# Patient Record
Sex: Female | Born: 1935 | Race: White | Hispanic: No | Marital: Married | Smoking: Former smoker
Health system: Southern US, Community
[De-identification: ages and names within clinical notes are randomized; demographics above are authoritative.]

## PROBLEM LIST (undated history)

## (undated) DIAGNOSIS — F419 Anxiety disorder, unspecified: Secondary | ICD-10-CM

## (undated) DIAGNOSIS — I8390 Asymptomatic varicose veins of unspecified lower extremity: Secondary | ICD-10-CM

## (undated) DIAGNOSIS — I219 Acute myocardial infarction, unspecified: Secondary | ICD-10-CM

## (undated) DIAGNOSIS — F0390 Unspecified dementia without behavioral disturbance: Secondary | ICD-10-CM

## (undated) HISTORY — DX: Anxiety disorder, unspecified: F41.9

## (undated) HISTORY — DX: Unspecified dementia, unspecified severity, without behavioral disturbance, psychotic disturbance, mood disturbance, and anxiety: F03.90

## (undated) HISTORY — PX: VARICOSE VEIN SURGERY: SHX832

---

## 2011-04-30 ENCOUNTER — Ambulatory Visit (INDEPENDENT_AMBULATORY_CARE_PROVIDER_SITE_OTHER): Payer: Medicare Other | Admitting: General Surgery

## 2011-04-30 ENCOUNTER — Encounter (INDEPENDENT_AMBULATORY_CARE_PROVIDER_SITE_OTHER): Payer: Self-pay

## 2011-04-30 ENCOUNTER — Encounter (INDEPENDENT_AMBULATORY_CARE_PROVIDER_SITE_OTHER): Payer: Self-pay | Admitting: General Surgery

## 2011-04-30 VITALS — BP 112/68 | HR 88 | Temp 98.2°F | Ht 60.0 in | Wt 150.0 lb

## 2011-04-30 DIAGNOSIS — C44529 Squamous cell carcinoma of skin of other part of trunk: Secondary | ICD-10-CM

## 2011-04-30 DIAGNOSIS — C210 Malignant neoplasm of anus, unspecified: Secondary | ICD-10-CM | POA: Insufficient documentation

## 2011-04-30 NOTE — Progress Notes (Signed)
HPI The patient comes in with a lesion on the left side of her anterior perianal area looking like a basal cell cancer or a squamous cell cancer. It has been there for at least the past month.  PE On examination she has a 1 cm circular partially reduce this lesion with some induration of the surrounding skin. There is no erythema or infection. It does not extend into the depths of the skin and does not extend into the rectum.  Under sterile technique with local anesthetic I was able to remove a small piece of this lesion and sent it for biopsy. No attempt to excise this was done in the office today.  The bleeding from this biopsy was controlled with silver nitrate sticks.  Studiy review Currently there are no studies to review.  Assessment Verrucous lesion a centimeter in size in the perianal area likely representing either a basal cell cancer squamous cell cancer.  Plan I will see the patient back in my office to review the pathology. It happened to get the pathology before her next clinic appointment we will call her with results further plans for surgery and will be made at the next clinic appointment.

## 2011-05-12 ENCOUNTER — Encounter (INDEPENDENT_AMBULATORY_CARE_PROVIDER_SITE_OTHER): Payer: Medicare Other | Admitting: General Surgery

## 2011-05-26 ENCOUNTER — Ambulatory Visit (INDEPENDENT_AMBULATORY_CARE_PROVIDER_SITE_OTHER): Payer: Medicare Other | Admitting: General Surgery

## 2011-05-26 ENCOUNTER — Encounter (INDEPENDENT_AMBULATORY_CARE_PROVIDER_SITE_OTHER): Payer: Self-pay | Admitting: General Surgery

## 2011-05-26 ENCOUNTER — Telehealth (INDEPENDENT_AMBULATORY_CARE_PROVIDER_SITE_OTHER): Payer: Self-pay | Admitting: General Surgery

## 2011-05-26 VITALS — BP 140/70 | HR 68 | Temp 97.8°F | Resp 18 | Ht 61.0 in | Wt 152.0 lb

## 2011-05-26 DIAGNOSIS — C44529 Squamous cell carcinoma of skin of other part of trunk: Secondary | ICD-10-CM | POA: Insufficient documentation

## 2011-05-26 NOTE — Progress Notes (Signed)
HPI The patient is status post incisional biopsy of a perianal mass.  The pathology demonstrated a squamous cell carcinoma.  PE On examination she has about a 1 cm residual mass in the perianal area on the left side. Slightly anterior to the anal orifice.  Studiy review There are currently no studies to review.  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  Plan The proper treatment for this would be local excision. This will be with his a centimeter margin. This has to be done as an outpatient procedure likely at [facility]

## 2011-06-24 ENCOUNTER — Encounter (HOSPITAL_COMMUNITY): Payer: Self-pay | Admitting: Pharmacy Technician

## 2011-07-01 ENCOUNTER — Telehealth (INDEPENDENT_AMBULATORY_CARE_PROVIDER_SITE_OTHER): Payer: Self-pay | Admitting: General Surgery

## 2011-07-01 ENCOUNTER — Encounter (HOSPITAL_COMMUNITY): Payer: Self-pay | Admitting: *Deleted

## 2011-07-01 NOTE — Progress Notes (Addendum)
A companion will be with Mrs. Yip the day of surgery.  This companion is unable to do any care for patient.  Should patient need dressing changes, she will need Home Health, preferably [facility].   Pt may say that she can change her dressing, but she will forget And here is no one in the home to do the dressing.    >10 years ago pt had n EKG that said MI, undetermined age.  Pt and family are unaware of when she had MI. This EKG was done in [place].  Pts daughter said that she that patient has not had a EKG sincce she moved here 2 years  Ago.  SHe doe not think Dr Katherina Right has a srecord of this EKG.  Pt's medically MD Is Dr Katherina Right.     Companion's name is Actor.  Information given to [facility] at Dr Dixon Boos office.

## 2011-07-01 NOTE — Telephone Encounter (Signed)
Jan, at [facility], calling about Dr. Dixon Boos patient having surgery Monday.  Pt's daughter has POA and is in [place] state communicating closely with all involved with her mother's care.  She states her Mom has "mild dementia" and will answer "yes" to nearly all questions.  Consequently, if asked,she will say she can do her own dressing changes, when she really cannot.  There is only a companion at her home.  She will need home health (prefers [place]) if any medical procedures are due after discharge to home.

## 2011-07-05 MED ORDER — DEXTROSE 5 % IV SOLN
1.0000 g | INTRAVENOUS | Status: AC
  Administered 2011-07-06: 1 g via INTRAVENOUS
  Filled 2011-07-05: qty 1

## 2011-07-06 ENCOUNTER — Encounter (HOSPITAL_COMMUNITY): Payer: Self-pay | Admitting: Anesthesiology

## 2011-07-06 ENCOUNTER — Encounter (HOSPITAL_COMMUNITY)
Admission: RE | Disposition: A | Discharge status: Home / Self Care | Payer: Self-pay | Source: Ambulatory Visit | Attending: General Surgery

## 2011-07-06 ENCOUNTER — Ambulatory Visit (HOSPITAL_COMMUNITY): Payer: Medicare Other

## 2011-07-06 ENCOUNTER — Ambulatory Visit (HOSPITAL_COMMUNITY): Payer: Medicare Other | Admitting: Anesthesiology

## 2011-07-06 ENCOUNTER — Ambulatory Visit (HOSPITAL_COMMUNITY)
Admission: RE | Admit: 2011-07-06 | Discharge: 2011-07-06 | Disposition: A | Discharge status: Home / Self Care | Payer: Medicare Other | Source: Ambulatory Visit | Attending: General Surgery | Admitting: General Surgery

## 2011-07-06 DIAGNOSIS — C44509 Unspecified malignant neoplasm of skin of other part of trunk: Secondary | ICD-10-CM | POA: Insufficient documentation

## 2011-07-06 DIAGNOSIS — C44529 Squamous cell carcinoma of skin of other part of trunk: Secondary | ICD-10-CM

## 2011-07-06 DIAGNOSIS — C44599 Other specified malignant neoplasm of skin of other part of trunk: Secondary | ICD-10-CM | POA: Insufficient documentation

## 2011-07-06 DIAGNOSIS — L940 Localized scleroderma [morphea]: Secondary | ICD-10-CM | POA: Insufficient documentation

## 2011-07-06 HISTORY — DX: Asymptomatic varicose veins of unspecified lower extremity: I83.90

## 2011-07-06 HISTORY — PX: LESION DESTRUCTION: SHX5236

## 2011-07-06 HISTORY — PX: OTHER SURGICAL HISTORY: SHX169

## 2011-07-06 HISTORY — DX: Acute myocardial infarction, unspecified: I21.9

## 2011-07-06 LAB — DIFFERENTIAL
Basophils Absolute: 0 10*3/uL (ref 0.0–0.1)
Eosinophils Relative: 4 % (ref 0–5)
Lymphocytes Relative: 25 % (ref 12–46)
Monocytes Absolute: 0.6 10*3/uL (ref 0.1–1.0)

## 2011-07-06 LAB — BASIC METABOLIC PANEL
BUN: 17 mg/dL (ref 6–23)
CO2: 27 mEq/L (ref 19–32)
Chloride: 104 mEq/L (ref 96–112)
Creatinine, Ser: 0.87 mg/dL (ref 0.50–1.10)
Glucose, Bld: 93 mg/dL (ref 70–99)

## 2011-07-06 LAB — CBC
HCT: 44.4 % (ref 36.0–46.0)
MCV: 93.5 fL (ref 78.0–100.0)
RDW: 12.4 % (ref 11.5–15.5)
WBC: 5.9 10*3/uL (ref 4.0–10.5)

## 2011-07-06 SURGERY — DESTRUCTION, LESION, ANUS
Anesthesia: General | Site: Rectum | Wound class: Clean Contaminated

## 2011-07-06 MED ORDER — MIDAZOLAM HCL 2 MG/2ML IJ SOLN: 1.0000 mg | INTRAMUSCULAR | Status: DC | PRN

## 2011-07-06 MED ORDER — 0.9 % SODIUM CHLORIDE (POUR BTL) OPTIME
TOPICAL | Status: DC | PRN
  Administered 2011-07-06: 1000 mL

## 2011-07-06 MED ORDER — HYDROMORPHONE HCL PF 1 MG/ML IJ SOLN: 0.2500 mg | INTRAMUSCULAR | Status: DC | PRN

## 2011-07-06 MED ORDER — LACTATED RINGERS IV SOLN
INTRAVENOUS | Status: DC
  Administered 2011-07-06: 11:00:00 via INTRAVENOUS

## 2011-07-06 MED ORDER — GLYCOPYRROLATE 0.2 MG/ML IJ SOLN
INTRAMUSCULAR | Status: DC | PRN
  Administered 2011-07-06: .2 mg via INTRAVENOUS

## 2011-07-06 MED ORDER — POVIDONE-IODINE 10 % EX OINT
TOPICAL_OINTMENT | CUTANEOUS | Status: DC | PRN
  Administered 2011-07-06: 1 via TOPICAL

## 2011-07-06 MED ORDER — FENTANYL CITRATE 0.05 MG/ML IJ SOLN
INTRAMUSCULAR | Status: DC | PRN
  Administered 2011-07-06: 100 ug via INTRAVENOUS
  Administered 2011-07-06: 50 ug via INTRAVENOUS

## 2011-07-06 MED ORDER — EPHEDRINE SULFATE 50 MG/ML IJ SOLN
INTRAMUSCULAR | Status: DC | PRN
  Administered 2011-07-06: 5 mg via INTRAVENOUS
  Administered 2011-07-06: 10 mg via INTRAVENOUS

## 2011-07-06 MED ORDER — PROPOFOL 10 MG/ML IV EMUL
INTRAVENOUS | Status: DC | PRN
  Administered 2011-07-06: 150 mg via INTRAVENOUS
  Administered 2011-07-06: 20 mg via INTRAVENOUS

## 2011-07-06 MED ORDER — LORAZEPAM 2 MG/ML IJ SOLN: 1.0000 mg | Freq: Once | INTRAMUSCULAR | Status: DC | PRN

## 2011-07-06 MED ORDER — BUPIVACAINE-EPINEPHRINE 0.25% -1:200000 IJ SOLN
INTRAMUSCULAR | Status: DC | PRN
  Administered 2011-07-06: 9 mL

## 2011-07-06 MED ORDER — MUPIROCIN 2 % EX OINT
TOPICAL_OINTMENT | CUTANEOUS | Status: AC
  Filled 2011-07-06: qty 22

## 2011-07-06 MED ORDER — ONDANSETRON HCL 4 MG/2ML IJ SOLN
INTRAMUSCULAR | Status: DC | PRN
  Administered 2011-07-06: 4 mg via INTRAVENOUS

## 2011-07-06 MED ORDER — LACTATED RINGERS IV SOLN
INTRAVENOUS | Status: DC | PRN
  Administered 2011-07-06 (×2): via INTRAVENOUS

## 2011-07-06 MED ORDER — TRAMADOL HCL 50 MG PO TABS: 50.0000 mg | ORAL_TABLET | Freq: Four times a day (QID) | ORAL | Status: AC | PRN

## 2011-07-06 MED ORDER — FENTANYL CITRATE 0.05 MG/ML IJ SOLN: 50.0000 ug | INTRAMUSCULAR | Status: DC | PRN

## 2011-07-06 SURGICAL SUPPLY — 41 items
BENZOIN TINCTURE PRP APPL 2/3 (GAUZE/BANDAGES/DRESSINGS) ×2 IMPLANT
BLADE SURG 15 STRL LF DISP TIS (BLADE) ×1 IMPLANT
BLADE SURG 15 STRL SS (BLADE) ×1
CANISTER SUCTION 2500CC (MISCELLANEOUS) ×2 IMPLANT
CLEANER TIP ELECTROSURG 2X2 (MISCELLANEOUS) ×2 IMPLANT
CLOTH BEACON ORANGE TIMEOUT ST (SAFETY) ×2 IMPLANT
COVER SURGICAL LIGHT HANDLE (MISCELLANEOUS) ×2 IMPLANT
DRAPE LAPAROTOMY T 102X78X121 (DRAPES) ×2 IMPLANT
DRAPE UTILITY 15X26 W/TAPE STR (DRAPE) ×4 IMPLANT
DRSG PAD ABDOMINAL 8X10 ST (GAUZE/BANDAGES/DRESSINGS) ×2 IMPLANT
ELECT REM PT RETURN 9FT ADLT (ELECTROSURGICAL) ×2
ELECTRODE REM PT RTRN 9FT ADLT (ELECTROSURGICAL) ×1 IMPLANT
GAUZE SPONGE 4X4 16PLY XRAY LF (GAUZE/BANDAGES/DRESSINGS) ×2 IMPLANT
GLOVE BIOGEL PI IND STRL 7.0 (GLOVE) ×1 IMPLANT
GLOVE BIOGEL PI IND STRL 8 (GLOVE) ×1 IMPLANT
GLOVE BIOGEL PI INDICATOR 7.0 (GLOVE) ×1
GLOVE BIOGEL PI INDICATOR 8 (GLOVE) ×1
GLOVE ECLIPSE 7.5 STRL STRAW (GLOVE) ×2 IMPLANT
GLOVE SURG SS PI 6.5 STRL IVOR (GLOVE) ×2 IMPLANT
GOWN STRL NON-REIN LRG LVL3 (GOWN DISPOSABLE) ×4 IMPLANT
KIT BASIN OR (CUSTOM PROCEDURE TRAY) ×2 IMPLANT
KIT ROOM TURNOVER OR (KITS) ×2 IMPLANT
NEEDLE HYPO 25X1 1.5 SAFETY (NEEDLE) ×2 IMPLANT
NS IRRIG 1000ML POUR BTL (IV SOLUTION) ×2 IMPLANT
PACK LITHOTOMY IV (CUSTOM PROCEDURE TRAY) ×2 IMPLANT
PAD ARMBOARD 7.5X6 YLW CONV (MISCELLANEOUS) ×2 IMPLANT
PENCIL BUTTON HOLSTER BLD 10FT (ELECTRODE) ×2 IMPLANT
SPECIMEN JAR SMALL (MISCELLANEOUS) ×2 IMPLANT
SPONGE GAUZE 4X4 12PLY (GAUZE/BANDAGES/DRESSINGS) ×2 IMPLANT
SPONGE SURGIFOAM ABS GEL 100 (HEMOSTASIS) IMPLANT
SURGILUBE 2OZ TUBE FLIPTOP (MISCELLANEOUS) ×2 IMPLANT
SUT CHROMIC 3 0 SH 27 (SUTURE) IMPLANT
SUT ETHILON 3 0 FSL (SUTURE) ×4 IMPLANT
SYR CONTROL 10ML LL (SYRINGE) ×2 IMPLANT
TOWEL OR 17X24 6PK STRL BLUE (TOWEL DISPOSABLE) ×2 IMPLANT
TOWEL OR 17X26 10 PK STRL BLUE (TOWEL DISPOSABLE) ×2 IMPLANT
TRAY PROCTOSCOPIC FIBER OPTIC (SET/KITS/TRAYS/PACK) IMPLANT
TUBE CONNECTING 12X1/4 (SUCTIONS) ×2 IMPLANT
UNDERPAD 30X30 INCONTINENT (UNDERPADS AND DIAPERS) ×2 IMPLANT
WATER STERILE IRR 1000ML POUR (IV SOLUTION) IMPLANT
YANKAUER SUCT BULB TIP NO VENT (SUCTIONS) ×2 IMPLANT

## 2011-07-06 NOTE — Discharge Instructions (Signed)
Skin Cancer Most skin cancers occur on the face, neck, arms, or on sun-exposed areas. Damage from excessive sunlight exposure is the most common cause of skin cancer. People with light skin are more likely to develop skin cancer than people with more skin pigment, but skin cancers occur in people of all races. Squamous and basal cell cancers are the most common types. They are both slow-growing and easy to recognize. They often form scaly open sores that do not heal. Melanoma is a skin cancer that looks like a mole. Most moles are harmless, especially if they do not change over time. Most skin cancers can be prevented by avoiding excessive sunlight exposure. Signs that a skin sore or mole may be cancerous include:  A sore that does not heal or one that bleeds or slowly gets bigger.   Moles that are growing or are larger than a pencil eraser, or ones that have irregular borders or color variations.   Moles that have a bump, a raised area, or cause itching or pain.  If you have a skin sore or mole that could be cancer, it is important that you call your doctor right away for a skin exam and possible skin biopsy to determine the best treatment. Document Released: 04/09/2004 Document Revised: 02/19/2011 Document Reviewed: 07/20/2008 [facility] Patient Information 2012 [place], [place].  Keep clean and dry.  Return to see me in two weeks.

## 2011-07-06 NOTE — Op Note (Signed)
OPERATIVE REPORT  DATE OF OPERATION: 07/06/2011  PATIENT:  Laurie Woods  78 y.o. female  PRE-OPERATIVE DIAGNOSIS:  squamous cell cancer of peri-anal area  POST-OPERATIVE DIAGNOSIS:  squamous cell cancer of peri-anal area  PROCEDURE:  Procedure(s): DESTRUCTION LESION ANUS  SURGEON:  Surgeon(s): Cherylynn Ridges, MD  ASSISTANT: None   ANESTHESIA:   general  EBL: <20 ml  BLOOD ADMINISTERED: none  DRAINS: none   SPECIMEN:  Source of Specimen:  Left anterior peri-anal eleiptical skin   COUNTS CORRECT:  YES  PROCEDURE DETAILS: The patient was taken to the operating room and placed on the table in the supine position. After an adequate general laryngeal area anesthetic was administered she was placed in lithotomy and prepped and draped in usual sterile manner exposing the perianal area.  The patient had previously had a 1-1/2 cm circular raised lesion on the left anterior portion of the perianal area. Since the last examination this is completely disappeared and all she has serous scar tissue. This is a regression of a previously diagnosed squamous cell carcinoma in the perianal area.  Because of the possibility that this could return I did re\re excise the area. This was done after proper timeout was performed identifying the patient and procedure be performed. An elliptical piece of skin including the previously taken biopsy site was done. It measured approximately 1 x 3 cm in size. It was subsequently closed with interrupted 3-0 nylon sutures a total of 7 sutures were used.  A sterile dressing was applied.  PATIENT DISPOSITION:  PACU - hemodynamically stable.   Clover Feehan III,Mervin Ramires O 4/22/20131:19 PM

## 2011-07-06 NOTE — Preoperative (Signed)
Beta Blockers   Reason not to administer Beta Blockers:Not Applicable 

## 2011-07-06 NOTE — Anesthesia Postprocedure Evaluation (Signed)
  Anesthesia Post-op Note  Patient: Laurie Woods  Procedure(s) Performed: Procedure(s) (LRB): DESTRUCTION LESION ANUS (N/A)  Patient Location: PACU  Anesthesia Type: General  Level of Consciousness: awake and alert   Airway and Oxygen Therapy: Patient Spontanous Breathing  Post-op Pain: mild  Post-op Assessment: Post-op Vital signs reviewed, Patient's Cardiovascular Status Stable, Respiratory Function Stable and Patent Airway  Post-op Vital Signs: stable  Complications: No apparent anesthesia complications

## 2011-07-06 NOTE — H&P (Signed)
  HPI  The patient comes in with a lesion on the left side of her anterior perianal area looking like a basal cell cancer or a squamous cell cancer. It has been there for at least the past month.  PE  On examination she has a 1 cm circular partially reduce this lesion with some induration of the surrounding skin. There is no erythema or infection. It does not extend into the depths of the skin and does not extend into the rectum.  Under sterile technique with local anesthetic I was able to remove a small piece of this lesion and sent it for biopsy. No attempt to excise this was done in the office today.  The bleeding from this biopsy was controlled with silver nitrate sticks.  Studiy review  Currently there are no studies to review.  Assessment  Verrucous lesion a centimeter in size in the perianal area likely representing either a basal cell cancer squamous cell cancer.  Plan  I will see the patient back in my office to review the pathology. It happened to get the pathology before her next clinic appointment we will call her with results further plans for surgery and will be made at the next clinic appointment.      The patient and the family are aware of the diagnosis of peri-anal squamous cell carcinoma.  The patient has signficant dementia and does not recall most of our conversation.  Marta Lamas. Gae Bon, MD, FACS [phone] [facility]

## 2011-07-06 NOTE — Progress Notes (Signed)
Mesh panties in place

## 2011-07-06 NOTE — Transfer of Care (Signed)
Immediate Anesthesia Transfer of Care Note  Patient: Laurie Woods  Procedure(s) Performed: Procedure(s) (LRB): DESTRUCTION LESION ANUS (N/A)  Patient Location: PACU  Anesthesia Type: General  Level of Consciousness: awake  Airway & Oxygen Therapy: Patient Spontanous Breathing and Patient connected to nasal cannula oxygen  Post-op Assessment: Report given to PACU RN and Post -op Vital signs reviewed and stable  Post vital signs: Reviewed and stable  Complications: No apparent anesthesia complications

## 2011-07-06 NOTE — Anesthesia Procedure Notes (Signed)
Procedure Name: LMA Insertion Date/Time: 07/06/2011 12:48 PM Performed by: Sherie Don Pre-anesthesia Checklist: Patient identified, Emergency Drugs available, Suction available, Patient being monitored and Timeout performed Patient Re-evaluated:Patient Re-evaluated prior to inductionOxygen Delivery Method: Circle system utilized Preoxygenation: Pre-oxygenation with 100% oxygen Intubation Type: IV induction LMA: LMA inserted LMA Size: 4.0 Number of attempts: 1

## 2011-07-06 NOTE — Anesthesia Preprocedure Evaluation (Addendum)
Anesthesia Evaluation  Patient identified by MRN, date of birth, ID band Patient awake    Reviewed: Allergy & Precautions, H&P , NPO status , Patient's Chart, lab work & pertinent test results  Airway Mallampati: I TM Distance: >3 FB Neck ROM: Full    Dental  (+) Teeth Intact and Dental Advisory Given   Pulmonary    Pulmonary exam normal       Cardiovascular + Past MI     Neuro/Psych Anxiety    GI/Hepatic   Endo/Other    Renal/GU      Musculoskeletal   Abdominal   Peds  Hematology   Anesthesia Other Findings   Reproductive/Obstetrics                          Anesthesia Physical Anesthesia Plan  ASA: III  Anesthesia Plan: General   Post-op Pain Management:    Induction: Intravenous  Airway Management Planned: LMA  Additional Equipment:   Intra-op Plan:   Post-operative Plan: Extubation in OR  Informed Consent: I have reviewed the patients History and Physical, chart, labs and discussed the procedure including the risks, benefits and alternatives for the proposed anesthesia with the patient or authorized representative who has indicated his/her understanding and acceptance.   Dental advisory given  Plan Discussed with: CRNA and Surgeon  Anesthesia Plan Comments:        Anesthesia Quick Evaluation

## 2011-07-07 ENCOUNTER — Encounter (HOSPITAL_COMMUNITY): Payer: Self-pay | Admitting: General Surgery

## 2011-07-09 ENCOUNTER — Telehealth (INDEPENDENT_AMBULATORY_CARE_PROVIDER_SITE_OTHER): Payer: Self-pay

## 2011-07-09 NOTE — Telephone Encounter (Signed)
Left message for Laurie Woods to call back and get appointment info for patient.

## 2011-07-15 ENCOUNTER — Telehealth (INDEPENDENT_AMBULATORY_CARE_PROVIDER_SITE_OTHER): Payer: Self-pay | Admitting: General Surgery

## 2011-07-15 ENCOUNTER — Encounter (INDEPENDENT_AMBULATORY_CARE_PROVIDER_SITE_OTHER): Payer: Self-pay | Admitting: General Surgery

## 2011-07-15 ENCOUNTER — Ambulatory Visit (INDEPENDENT_AMBULATORY_CARE_PROVIDER_SITE_OTHER): Payer: Medicare Other | Admitting: General Surgery

## 2011-07-15 ENCOUNTER — Telehealth (INDEPENDENT_AMBULATORY_CARE_PROVIDER_SITE_OTHER): Payer: Self-pay

## 2011-07-15 VITALS — BP 135/78 | HR 80 | Temp 97.0°F | Ht 61.0 in | Wt 147.2 lb

## 2011-07-15 DIAGNOSIS — Z09 Encounter for follow-up examination after completed treatment for conditions other than malignant neoplasm: Secondary | ICD-10-CM

## 2011-07-15 NOTE — Progress Notes (Signed)
HPI The patient then having some difficulty with her normal energy level responsiveness and alertness and surgery. The family was concerned about what was going.  PE On examination there is some mild peri-incisional erythema no drainage no pus and no evidence of acute infection.  Studiy review I reviewed her pathology results which demonstrated no evidence of residual squamous cell carcinoma.  Assessment I believe that the patient's postoperative problems we'll likely secondary to her response anesthesia. I do not feel as though she has any significant infection causing her to have systemic neurologic problems. I don't think she has a local infection there requires aggressive treatment or antibiotics.  I removed there is sutures and applied a 1/8 inch Steri-Strips. There was a small gap in the incision but it did not come opened completely.  The patient's caregiver is present during the process and understands how to care for the wound. I will see her back in about 2 weeks.  Plan [] 

## 2011-07-15 NOTE — Telephone Encounter (Signed)
Laurie Woods (daughter) called concerned with her mother's recovery, she reports patient not stable on her feet, staying in the bed, increase in memory loss and overall just not feeling well.  Daughter not sure if this is related to her surgery or an underlying medical issue.  Message will be given to Dr. Lindie Spruce during patient follow up appointment @ 2:00 pm.

## 2011-07-23 ENCOUNTER — Ambulatory Visit (INDEPENDENT_AMBULATORY_CARE_PROVIDER_SITE_OTHER): Payer: Medicare Other | Admitting: General Surgery

## 2011-07-23 VITALS — BP 128/84 | HR 71 | Temp 98.2°F | Resp 16 | Ht 61.0 in | Wt 151.0 lb

## 2011-07-23 DIAGNOSIS — T148XXA Other injury of unspecified body region, initial encounter: Secondary | ICD-10-CM | POA: Insufficient documentation

## 2011-07-23 NOTE — Progress Notes (Signed)
HPI The patient comes in today to evaluate her wound. On examination it has separated with a small amount of exudate on the surface. Does not appear to be infected  PE A needle by 1 cm area of open wound with small amount of exudate on the surface. There is no surrounding erythema. There is no infection.  Studiy review None.  Assessment Open postoperative wound.  Plan The patient should cleanse it with a detachable shower head wash off all the exudate, and patted dry. She didn't recover with some antibiotic ointment and a 4 x 4 gauze. Because this will take some time to heal I will see the patient back in one month.

## 2011-08-25 ENCOUNTER — Ambulatory Visit (INDEPENDENT_AMBULATORY_CARE_PROVIDER_SITE_OTHER): Payer: Medicare Other | Admitting: General Surgery

## 2011-08-25 ENCOUNTER — Encounter (INDEPENDENT_AMBULATORY_CARE_PROVIDER_SITE_OTHER): Payer: Self-pay | Admitting: General Surgery

## 2011-08-25 VITALS — BP 132/80 | HR 68 | Temp 96.9°F | Resp 16 | Ht 60.0 in | Wt 148.5 lb

## 2011-08-25 DIAGNOSIS — Z09 Encounter for follow-up examination after completed treatment for conditions other than malignant neoplasm: Secondary | ICD-10-CM

## 2011-08-25 NOTE — Progress Notes (Signed)
HPI The patient is status post excision of a perianal skin cancer. The wound is healed well  PE Complete healing of her operative site.  Studiy review None  Assessment Complete healing status post excision of perianal cancer to  Plan Return to see me on a p.r.n. basis.

## 2014-10-03 ENCOUNTER — Encounter (HOSPITAL_COMMUNITY): Payer: Self-pay | Admitting: *Deleted

## 2014-10-03 ENCOUNTER — Emergency Department (HOSPITAL_COMMUNITY): Payer: Medicare Other

## 2014-10-03 ENCOUNTER — Emergency Department (HOSPITAL_COMMUNITY)
Admission: EM | Admit: 2014-10-03 | Discharge: 2014-10-03 | Disposition: A | Discharge status: Home / Self Care | Payer: Medicare Other | Attending: Emergency Medicine | Admitting: Emergency Medicine

## 2014-10-03 DIAGNOSIS — L02413 Cutaneous abscess of right upper limb: Secondary | ICD-10-CM | POA: Diagnosis present

## 2014-10-03 DIAGNOSIS — I252 Old myocardial infarction: Secondary | ICD-10-CM | POA: Insufficient documentation

## 2014-10-03 DIAGNOSIS — Z87891 Personal history of nicotine dependence: Secondary | ICD-10-CM | POA: Diagnosis not present

## 2014-10-03 DIAGNOSIS — L0291 Cutaneous abscess, unspecified: Secondary | ICD-10-CM

## 2014-10-03 DIAGNOSIS — Z8679 Personal history of other diseases of the circulatory system: Secondary | ICD-10-CM | POA: Diagnosis not present

## 2014-10-03 DIAGNOSIS — Z23 Encounter for immunization: Secondary | ICD-10-CM | POA: Insufficient documentation

## 2014-10-03 DIAGNOSIS — F0390 Unspecified dementia without behavioral disturbance: Secondary | ICD-10-CM | POA: Diagnosis not present

## 2014-10-03 DIAGNOSIS — L03113 Cellulitis of right upper limb: Secondary | ICD-10-CM | POA: Diagnosis not present

## 2014-10-03 DIAGNOSIS — L0390 Cellulitis, unspecified: Secondary | ICD-10-CM

## 2014-10-03 LAB — CBC WITH DIFFERENTIAL/PLATELET
BASOS ABS: 0 10*3/uL (ref 0.0–0.1)
Basophils Relative: 0 % (ref 0–1)
EOS PCT: 2 % (ref 0–5)
Eosinophils Absolute: 0.1 10*3/uL (ref 0.0–0.7)
HCT: 44.1 % (ref 36.0–46.0)
HEMOGLOBIN: 14.6 g/dL (ref 12.0–15.0)
Lymphocytes Relative: 12 % (ref 12–46)
Lymphs Abs: 1.1 10*3/uL (ref 0.7–4.0)
MCH: 31.7 pg (ref 26.0–34.0)
MCHC: 33.1 g/dL (ref 30.0–36.0)
MCV: 95.7 fL (ref 78.0–100.0)
MONO ABS: 1.2 10*3/uL — AB (ref 0.1–1.0)
Monocytes Relative: 12 % (ref 3–12)
Neutro Abs: 7.1 10*3/uL (ref 1.7–7.7)
Neutrophils Relative %: 74 % (ref 43–77)
Platelets: 214 10*3/uL (ref 150–400)
RBC: 4.61 MIL/uL (ref 3.87–5.11)
RDW: 12.3 % (ref 11.5–15.5)
WBC: 9.6 10*3/uL (ref 4.0–10.5)

## 2014-10-03 LAB — BASIC METABOLIC PANEL
Anion gap: 8 (ref 5–15)
BUN: 17 mg/dL (ref 6–20)
CHLORIDE: 105 mmol/L (ref 101–111)
CO2: 27 mmol/L (ref 22–32)
CREATININE: 1.02 mg/dL — AB (ref 0.44–1.00)
Calcium: 8.7 mg/dL — ABNORMAL LOW (ref 8.9–10.3)
GFR calc Af Amer: 54 mL/min — ABNORMAL LOW (ref >60)
GFR, EST NON AFRICAN AMERICAN: 46 mL/min — AB (ref >60)
Glucose, Bld: 121 mg/dL — ABNORMAL HIGH (ref 65–99)
POTASSIUM: 4.2 mmol/L (ref 3.5–5.1)
SODIUM: 140 mmol/L (ref 135–145)

## 2014-10-03 MED ORDER — ACIDOPHILUS PROBIOTIC 10 MG PO TABS: 10.0000 mg | ORAL_TABLET | Freq: Three times a day (TID) | ORAL | Status: DC

## 2014-10-03 MED ORDER — LIDOCAINE HCL (PF) 1 % IJ SOLN
5.0000 mL | Freq: Once | INTRAMUSCULAR | Status: AC
  Administered 2014-10-03: 5 mL
  Filled 2014-10-03: qty 5

## 2014-10-03 MED ORDER — AMOXICILLIN-POT CLAVULANATE 875-125 MG PO TABS: 1.0000 | ORAL_TABLET | Freq: Two times a day (BID) | ORAL | Status: DC

## 2014-10-03 MED ORDER — TETANUS-DIPHTH-ACELL PERTUSSIS 5-2.5-18.5 LF-MCG/0.5 IM SUSP
0.5000 mL | Freq: Once | INTRAMUSCULAR | Status: AC
  Administered 2014-10-03: 0.5 mL via INTRAMUSCULAR
  Filled 2014-10-03: qty 0.5

## 2014-10-03 NOTE — ED Notes (Addendum)
Patient transported to X-ray 

## 2014-10-03 NOTE — ED Provider Notes (Signed)
Patient seen/examined in the Emergency Department in conjunction with Midlevel Provider  Patient reports pain/swelling to right forearm Exam : pt awake/alert, swelling/drainage from right forearm, likely abscess Plan: start antibiotics.  Questionable fall reported, right forearm xray ordered    Ripley Fraise, MD 10/03/14 1041

## 2014-10-03 NOTE — Discharge Instructions (Signed)
Please change patient's dressing twice per day.  Abscess Care After An abscess (also called a boil or furuncle) is an infected area that contains a collection of pus. Signs and symptoms of an abscess include pain, tenderness, redness, or hardness, or you may feel a moveable soft area under your skin. An abscess can occur anywhere in the body. The infection may spread to surrounding tissues causing cellulitis. A cut (incision) by the surgeon was made over your abscess and the pus was drained out. Gauze may have been packed into the space to provide a drain that will allow the cavity to heal from the inside outwards. The boil may be painful for 5 to 7 days. Most people with a boil do not have high fevers. Your abscess, if seen early, may not have localized, and may not have been lanced. If not, another appointment may be required for this if it does not get better on its own or with medications. HOME CARE INSTRUCTIONS   Only take over-the-counter or prescription medicines for pain, discomfort, or fever as directed by your caregiver.  When you bathe, soak and then remove gauze or iodoform packs at least daily or as directed by your caregiver. You may then wash the wound gently with mild soapy water. Repack with gauze or do as your caregiver directs. SEEK IMMEDIATE MEDICAL CARE IF:   You develop increased pain, swelling, redness, drainage, or bleeding in the wound site.  You develop signs of generalized infection including muscle aches, chills, fever, or a general ill feeling.  An oral temperature above 102 F (38.9 C) develops, not controlled by medication. See your caregiver for a recheck if you develop any of the symptoms described above. If medications (antibiotics) were prescribed, take them as directed. Document Released: 09/18/2004 Document Revised: 05/25/2011 Document Reviewed: 05/16/2007 [facility] Patient Information 2015 [place], [place]. This information is not intended to replace advice  given to you by your health care provider. Make sure you discuss any questions you have with your health care provider.  Cellulitis Cellulitis is an infection of the skin and the tissue beneath it. The infected area is usually red and tender. Cellulitis occurs most often in the arms and lower legs.  CAUSES  Cellulitis is caused by bacteria that enter the skin through cracks or cuts in the skin. The most common types of bacteria that cause cellulitis are staphylococci and streptococci. SIGNS AND SYMPTOMS   Redness and warmth.  Swelling.  Tenderness or pain.  Fever. DIAGNOSIS  Your health care provider can usually determine what is wrong based on a physical exam. Blood tests may also be done. TREATMENT  Treatment usually involves taking an antibiotic medicine. HOME CARE INSTRUCTIONS   Take your antibiotic medicine as directed by your health care provider. Finish the antibiotic even if you start to feel better.  Keep the infected arm or leg elevated to reduce swelling.  Apply a warm cloth to the affected area up to 4 times per day to relieve pain.  Take medicines only as directed by your health care provider.  Keep all follow-up visits as directed by your health care provider. SEEK MEDICAL CARE IF:   You notice red streaks coming from the infected area.  Your red area gets larger or turns dark in color.  Your bone or joint underneath the infected area becomes painful after the skin has healed.  Your infection returns in the same area or another area.  You notice a swollen bump in the infected area.  You develop new symptoms.  You have a fever. SEEK IMMEDIATE MEDICAL CARE IF:   You feel very sleepy.  You develop vomiting or diarrhea.  You have a general ill feeling (malaise) with muscle aches and pains. MAKE SURE YOU:   Understand these instructions.  Will watch your condition.  Will get help right away if you are not doing well or get worse. Document Released:  12/10/2004 Document Revised: 07/17/2013 Document Reviewed: 05/18/2011 [facility] Patient Information 2015 [place], [place]. This information is not intended to replace advice given to you by your health care provider. Make sure you discuss any questions you have with your health care provider. Dressing Change A dressing is a material placed over wounds. It keeps the wound clean, dry, and protected from further injury. This provides an environment that favors wound healing.  BEFORE YOU BEGIN  Get your supplies together. Things you may need include:  Saline solution.  Flexible gauze dressing.  Medicated cream.  Tape.  Gloves.  Abdominal dressing pads.  Gauze squares.  Plastic bags.  Take pain medicine 30 minutes before the dressing change if you need it.  Take a shower before you do the first dressing change of the day. Use plastic wrap or a plastic bag to prevent the dressing from getting wet. REMOVING YOUR OLD DRESSING   Wash your hands with soap and water. Dry your hands with a clean towel.  Put on your gloves.  Remove any tape.  Carefully remove the old dressing. If the dressing sticks, you may dampen it with warm water to loosen it, or follow your caregiver's specific directions.  Remove any gauze or packing tape that is in your wound.  Take off your gloves.  Put the gloves, tape, gauze, or any packing tape into a plastic bag. CHANGING YOUR DRESSING  Open the supplies.  Take the cap off the saline solution.  Open the gauze package so that the gauze remains on the inside of the package.  Put on your gloves.  Clean your wound as told by your caregiver.  If you have been told to keep your wound dry, follow those instructions.  Your caregiver may tell you to do one or more of the following:  Pick up the gauze. Pour the saline solution over the gauze. Squeeze out the extra saline solution.  Put medicated cream or other medicine on your wound if you have been  told to do so.  Put the solution soaked gauze only in your wound, not on the skin around it.  Pack your wound loosely or as told by your caregiver.  Put dry gauze on your wound.  Put abdominal dressing pads over the dry gauze if your wet gauze soaks through.  Tape the abdominal dressing pads in place so they will not fall off. Do not wrap the tape completely around the affected part (arm, leg, abdomen).  Wrap the dressing pads with a flexible gauze dressing to secure it in place.  Take off your gloves. Put them in the plastic bag with the old dressing. Tie the bag shut and throw it away.  Keep the dressing clean and dry until your next dressing change.  Wash your hands. SEEK MEDICAL CARE IF:  Your skin around the wound looks red.  Your wound feels more tender or sore.  You see pus in the wound.  Your wound smells bad.  You have a fever.  Your skin around the wound has a rash that itches and burns.  You see  black or yellow skin in your wound that was not there before.  You feel nauseous, throw up, and feel very tired. Document Released: 04/09/2004 Document Revised: 05/25/2011 Document Reviewed: 01/12/2011 [facility] Patient Information 2015 [place], [place]. This information is not intended to replace advice given to you by your health care provider. Make sure you discuss any questions you have with your health care provider.

## 2014-10-03 NOTE — ED Provider Notes (Signed)
CSN: [id number]     Arrival date & time 10/03/14  0940 History   First MD Initiated Contact with Patient 10/03/14 [phone]     Chief Complaint  Patient presents with  . Fall   Laurie Woods is a 81 y.o. female with a history of dementia coming from [facility] assisted living with her daughter complaining of redness and swelling to her right forearm near her wrist that was just noticed today. The area is draining purulent drainage. The patient and daughter thought that she had fallen because of this abscess, thinking this was an injury. The patient has dementia and does not remember falling. She is unsure if she fell or not. She denies any injury or pain. [facility] did not witness any fall. The patient takes walks daily on her own and she is very independent according to her daughter. The patient denies fevers, chills, recent illness, chest pain, cough, wheezing, shortness of breath, abdominal pain, nausea, vomiting, headache, lightheadedness, dizziness, urinary symptoms, neck pain, back pain, or rashes. After discussing with daughter and patient it becomes clear that the patient likely did not fall, as the only reason they thought she fell was because of this abscess.  Later the patient notes that she has a cat and wonders if her cat could have scratched or bit her.   (Consider location/radiation/quality/duration/timing/severity/associated sxs/prior Treatment) HPI  Past Medical History  Diagnosis Date  . Dementia   . Anxiety   . Myocardial infarction     per old MI in [place].  Pt and family are unaware.  . Varicose veins    Past Surgical History  Procedure Laterality Date  . Varicose vein surgery    . Lesion destruction  07/06/2011    Procedure: DESTRUCTION LESION ANUS;  Surgeon: Gwenyth Ober, MD;  Location: [facility];  Service: General;  Laterality: N/A;  Excision of peri-anal cancer  . 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 Heart  disease Father   . Pancreatic cancer Sister   . Anesthesia problems Neg Hx    History  Substance Use Topics  . Smoking status: Former Smoker -- 10 years    Quit date: 03/17/1963  . Smokeless tobacco: Never Used  . Alcohol Use: No     Comment: occasional   OB History    No data available     Review of Systems  Constitutional: Negative for fever and chills.  HENT: Negative for congestion, ear pain, sore throat and trouble swallowing.   Eyes: Negative for pain and visual disturbance.  Respiratory: Negative for cough, shortness of breath and wheezing.   Cardiovascular: Negative for chest pain, palpitations and leg swelling.  Gastrointestinal: Negative for nausea, vomiting, abdominal pain and diarrhea.  Genitourinary: Negative for dysuria, urgency, frequency, hematuria and difficulty urinating.  Musculoskeletal: Negative for back pain and neck pain.  Skin: Positive for color change. Negative for rash.  Neurological: Negative for dizziness, syncope, speech difficulty, weakness, light-headedness, numbness and headaches.      Allergies  Review of patient's allergies indicates no known allergies.  Home Medications   Prior to Admission medications   Medication Sig Start Date End Date Taking? Authorizing Provider  amoxicillin-clavulanate (AUGMENTIN) 875-125 MG per tablet Take 1 tablet by mouth every 12 (twelve) hours. 10/03/14   Waynetta Pean, PA-C  citalopram (CELEXA) 20 MG tablet Take 20 mg by mouth every morning.    Yes Historical Provider, MD  donepezil (ARICEPT) 5  MG tablet Take 5 mg by mouth every morning.    Yes Historical Provider, MD  Lactobacillus (ACIDOPHILUS PROBIOTIC) 10 MG TABS Take 10 mg by mouth 3 (three) times daily. 10/03/14   Waynetta Pean, PA-C  memantine (NAMENDA) 10 MG tablet Take 10 mg by mouth every morning.    Yes Historical Provider, MD   BP 123/57 mmHg  Pulse 65  Temp(Src) 97.6 F (36.4 C) (Oral)  Resp 16  Ht 5' (1.524 m)  Wt 158 lb (71.668 kg)  BMI  30.86 kg/m2  SpO2 96% Physical Exam  Constitutional: She appears well-developed and well-nourished. No distress.  Nontoxic appearing.  HENT:  Head: Normocephalic and atraumatic.  Right Ear: External ear normal.  Left Ear: External ear normal.  Mouth/Throat: Oropharynx is clear and moist. No oropharyngeal exudate.  Face is nontender to palpation. No evidence of trauma.  Eyes: Conjunctivae and EOM are normal. Pupils are equal, round, and reactive to light. Right eye exhibits no discharge. Left eye exhibits no discharge.  Neck: Normal range of motion. Neck supple. No JVD present. No tracheal deviation present.  Cardiovascular: Normal rate, regular rhythm, normal heart sounds and intact distal pulses.  Exam reveals no gallop and no friction rub.   No murmur heard. Bilateral radial, posterior tibialis and dorsalis pedis pulses are intact.    Pulmonary/Chest: Effort normal and breath sounds normal. No respiratory distress. She has no wheezes. She has no rales. She exhibits no tenderness.  Lungs are clear to auscultation bilaterally.  Abdominal: Soft. Bowel sounds are normal. She exhibits no distension. There is no tenderness. There is no guarding.  Abdomen is soft and nontender to palpation.  Musculoskeletal: Normal range of motion. She exhibits no edema or tenderness.  Patient's strength is 5 out of 5 in her bilateral upper and lower extremities. The patient is able to ambulate in the room without difficulty or assistance. There is no bony point tenderness on her upper or lower extremities. Patient has good range of motion of her right wrist, elbow and shoulder. No pain with movement of her right wrist, elbow or shoulder.  Lymphadenopathy:    She has no cervical adenopathy.  Neurological: She is alert. No cranial nerve deficit. Coordination normal.  The patient is oriented to person and place only. Patient is very pleasant. Cranial nerves are intact. Finger-to-nose intact bilaterally. No pronator  drift. Good equal grip strengths bilaterally. EOMs intact bilaterally. Sensation is intact to her bilateral upper and lower extremities.  Skin: Skin is warm and dry. No rash noted. She is not diaphoretic. There is erythema. No pallor.  There is a small superficial abscess with purulent drainage to her right forearm with overlying and surrounding erythema.   Psychiatric: She has a normal mood and affect. Her behavior is normal.  Nursing note and vitals reviewed.   ED Course  INCISION AND DRAINAGE Date/Time: 10/03/2014 11:45 AM Performed by: Waynetta Pean Authorized by: Waynetta Pean Consent: Verbal consent obtained. Risks and benefits: risks, benefits and alternatives were discussed Consent given by: patient and power of attorney Patient understanding: patient states understanding of the procedure being performed Patient consent: the patient's understanding of the procedure matches consent given Procedure consent: procedure consent matches procedure scheduled Relevant documents: relevant documents present and verified Test results: test results available and properly labeled Site marked: the operative site was marked Imaging studies: imaging studies available Required items: required blood products, implants, devices, and special equipment available Patient identity confirmed: verbally with patient Time out: Immediately prior to  procedure a "time out" was called to verify the correct patient, procedure, equipment, support staff and site/side marked as required. Type: abscess Body area: upper extremity Location details: right arm Anesthesia: local infiltration Local anesthetic: lidocaine 1% without epinephrine Anesthetic total: 1 ml Patient sedated: no Scalpel size: 11 Incision type: single straight Complexity: simple Drainage: purulent Drainage amount: moderate Wound treatment: wound left open Packing material: none Patient tolerance: Patient tolerated the procedure well with  no immediate complications   (including critical care time) Labs Review Labs Reviewed  BASIC METABOLIC PANEL - Abnormal; Notable for the following:    Glucose, Bld 121 (*)    Creatinine, Ser 1.02 (*)    Calcium 8.7 (*)    GFR calc non Af Amer 46 (*)    GFR calc Af Amer 54 (*)    All other components within normal limits  CBC WITH DIFFERENTIAL/PLATELET - Abnormal; Notable for the following:    Monocytes Absolute 1.2 (*)    All other components within normal limits    Imaging Review Dg Forearm Right  10/03/2014   CLINICAL DATA:  81 year old female with a history of pain in the right forearm. Redness and swelling. Open ulceration.  EXAM: RIGHT FOREARM - 2 VIEW  COMPARISON:  None.  FINDINGS: No acute bony abnormality is identified. Mild soft tissue swelling on the radial soft tissues at the distal forearm. No radiopaque foreign body. Degenerative changes at the elbow.  IMPRESSION: No acute bony abnormality.  Soft tissue swelling on the radial aspect of the distal forearm may correspond to the ulcer that is reported in the history. No radiopaque foreign body.  Signed,  Dulcy Fanny. Earleen Newport, DO  [facility]  [facility]   Electronically Signed   By: Corrie Mckusick D.O.   On: 10/03/2014 11:29     EKG Interpretation None      Filed Vitals:   10/03/14 1030 10/03/14 1045 10/03/14 1130 10/03/14 1132  BP: 91/52 106/52 123/57 123/57  Pulse: 69 69 69 65  Temp:      TempSrc:      Resp:    16  Height:      Weight:      SpO2: 96% 95% 99% 96%     MDM   Meds given in ED:  Medications  lidocaine (PF) (XYLOCAINE) 1 % injection 5 mL (5 mLs Infiltration Given by Other 10/03/14 1102)  Tdap (BOOSTRIX) injection 0.5 mL (0.5 mLs Intramuscular Given 10/03/14 1146)    New Prescriptions   AMOXICILLIN-CLAVULANATE (AUGMENTIN) 875-125 MG PER TABLET    Take 1 tablet by mouth every 12 (twelve) hours.   LACTOBACILLUS (ACIDOPHILUS PROBIOTIC) 10 MG TABS    Take 10 mg  by mouth 3 (three) times daily.    Final diagnoses:  Abscess and cellulitis   This is a 81 y.o. female with a history of dementia coming from [facility] assisted living with her daughter complaining of redness and swelling to her right forearm near her wrist that was just noticed today. The area is draining purulent drainage. The patient and daughter thought that she had fallen because of this abscess, thinking this was an injury. The patient has dementia and does not remember falling. She is unsure if she fell or not. On exam the patient is afebrile and nontoxic appearing. The patient has an abscess with overlying and surrounding cellulitis to her right forearm with pus draining. Patient has no bony point tenderness. She has no focal neurological deficits. Patient is  able to ambulate in the room without difficulty or assistance. The only reason they thought she felt was due to this abscess. This seems like a questionable fall. She has no signs of injury on her body. Patient's BMP indicates a creatinine 1.02. Patient encouraged to drink plenty of fluids. CBC is unremarkable. X-ray of her right forearm initiated soft tissue swelling at the radial aspect of the distal forearm. There is no acute bony abnormality on x-ray. Incision and drainage was performed by me and tolerated well by the patient. There is a moderate amount of purulent drainage from the abscess. I provided wound care instructions to the patient and to her daughter. Due to the questionable aspect of the calf biting or scratching her we'll place the patient on Augmentin for her cellulitis. Patient also given prescription for probiotic. I advised the patient to follow-up with their primary care provider this week. I advised the patient to return to the emergency department with new or worsening symptoms or new concerns. The patient and her daughter verbalized understanding and agreement with plan.    This patient was discussed with and evaluated  by Dr. Christy Gentles who agrees with assessment and plan.   Waynetta Pean, PA-C 10/03/14 [place], MD 10/03/14 [phone]

## 2014-10-03 NOTE — ED Notes (Signed)
Pt is in stable condition upon d/c and ambulates from ED. 

## 2014-10-03 NOTE — ED Notes (Signed)
Pt arrives with daughter from an assisted living ([facility]). Pt daughter states she had a fall yesterday that was unwitnessed. Pt also has an area of redness, swelling and warmth to her right wrist that has an open area with purulent drainage. Pt daughter states her mother is pretty independent and walks 1-2 miles daily in the evening. Pt has a hx of dementia, which is why she is in an assisted living facility.

## 2016-03-28 ENCOUNTER — Inpatient Hospital Stay (HOSPITAL_COMMUNITY)
Admission: EM | Admit: 2016-03-28 | Discharge: 2016-04-05 | DRG: 299 | Disposition: A | Discharge status: Skilled Nursing Facility | Payer: Medicare Other | Attending: Internal Medicine | Admitting: Internal Medicine

## 2016-03-28 ENCOUNTER — Encounter (HOSPITAL_COMMUNITY): Payer: Self-pay | Admitting: Emergency Medicine

## 2016-03-28 ENCOUNTER — Emergency Department (HOSPITAL_COMMUNITY): Payer: Medicare Other

## 2016-03-28 DIAGNOSIS — R3989 Other symptoms and signs involving the genitourinary system: Secondary | ICD-10-CM

## 2016-03-28 DIAGNOSIS — M79609 Pain in unspecified limb: Secondary | ICD-10-CM | POA: Diagnosis not present

## 2016-03-28 DIAGNOSIS — F419 Anxiety disorder, unspecified: Secondary | ICD-10-CM | POA: Diagnosis present

## 2016-03-28 DIAGNOSIS — N898 Other specified noninflammatory disorders of vagina: Secondary | ICD-10-CM | POA: Diagnosis not present

## 2016-03-28 DIAGNOSIS — R600 Localized edema: Secondary | ICD-10-CM | POA: Diagnosis not present

## 2016-03-28 DIAGNOSIS — Z66 Do not resuscitate: Secondary | ICD-10-CM | POA: Diagnosis present

## 2016-03-28 DIAGNOSIS — Z85828 Personal history of other malignant neoplasm of skin: Secondary | ICD-10-CM

## 2016-03-28 DIAGNOSIS — M7989 Other specified soft tissue disorders: Secondary | ICD-10-CM | POA: Diagnosis not present

## 2016-03-28 DIAGNOSIS — F0280 Dementia in other diseases classified elsewhere without behavioral disturbance: Secondary | ICD-10-CM | POA: Diagnosis not present

## 2016-03-28 DIAGNOSIS — I749 Embolism and thrombosis of unspecified artery: Secondary | ICD-10-CM | POA: Diagnosis not present

## 2016-03-28 DIAGNOSIS — I252 Old myocardial infarction: Secondary | ICD-10-CM

## 2016-03-28 DIAGNOSIS — I2692 Saddle embolus of pulmonary artery without acute cor pulmonale: Secondary | ICD-10-CM | POA: Diagnosis present

## 2016-03-28 DIAGNOSIS — L0390 Cellulitis, unspecified: Secondary | ICD-10-CM

## 2016-03-28 DIAGNOSIS — F03918 Unspecified dementia, unspecified severity, with other behavioral disturbance: Secondary | ICD-10-CM

## 2016-03-28 DIAGNOSIS — L03116 Cellulitis of left lower limb: Secondary | ICD-10-CM | POA: Diagnosis present

## 2016-03-28 DIAGNOSIS — I2699 Other pulmonary embolism without acute cor pulmonale: Secondary | ICD-10-CM | POA: Diagnosis not present

## 2016-03-28 DIAGNOSIS — I82402 Acute embolism and thrombosis of unspecified deep veins of left lower extremity: Secondary | ICD-10-CM | POA: Diagnosis not present

## 2016-03-28 DIAGNOSIS — N390 Urinary tract infection, site not specified: Secondary | ICD-10-CM | POA: Diagnosis not present

## 2016-03-28 DIAGNOSIS — F0391 Unspecified dementia with behavioral disturbance: Secondary | ICD-10-CM | POA: Diagnosis not present

## 2016-03-28 DIAGNOSIS — Z87891 Personal history of nicotine dependence: Secondary | ICD-10-CM | POA: Diagnosis not present

## 2016-03-28 DIAGNOSIS — R7989 Other specified abnormal findings of blood chemistry: Secondary | ICD-10-CM

## 2016-03-28 DIAGNOSIS — Z8249 Family history of ischemic heart disease and other diseases of the circulatory system: Secondary | ICD-10-CM | POA: Diagnosis not present

## 2016-03-28 DIAGNOSIS — R319 Hematuria, unspecified: Secondary | ICD-10-CM | POA: Diagnosis not present

## 2016-03-28 DIAGNOSIS — I8390 Asymptomatic varicose veins of unspecified lower extremity: Secondary | ICD-10-CM | POA: Diagnosis present

## 2016-03-28 DIAGNOSIS — F05 Delirium due to known physiological condition: Secondary | ICD-10-CM | POA: Diagnosis present

## 2016-03-28 DIAGNOSIS — Z79899 Other long term (current) drug therapy: Secondary | ICD-10-CM

## 2016-03-28 DIAGNOSIS — G3183 Dementia with Lewy bodies: Secondary | ICD-10-CM | POA: Diagnosis not present

## 2016-03-28 DIAGNOSIS — I82492 Acute embolism and thrombosis of other specified deep vein of left lower extremity: Secondary | ICD-10-CM | POA: Diagnosis not present

## 2016-03-28 DIAGNOSIS — IMO0002 Reserved for concepts with insufficient information to code with codable children: Secondary | ICD-10-CM

## 2016-03-28 LAB — CBC WITH DIFFERENTIAL/PLATELET
Basophils Absolute: 0 10*3/uL (ref 0.0–0.1)
Basophils Relative: 0 %
EOS ABS: 0.1 10*3/uL (ref 0.0–0.7)
EOS PCT: 1 %
HCT: 41.2 % (ref 36.0–46.0)
Hemoglobin: 13.6 g/dL (ref 12.0–15.0)
LYMPHS ABS: 1.6 10*3/uL (ref 0.7–4.0)
Lymphocytes Relative: 10 %
MCH: 31.3 pg (ref 26.0–34.0)
MCHC: 33 g/dL (ref 30.0–36.0)
MCV: 94.7 fL (ref 78.0–100.0)
MONOS PCT: 11 %
Monocytes Absolute: 1.8 10*3/uL — ABNORMAL HIGH (ref 0.1–1.0)
Neutro Abs: 12.9 10*3/uL — ABNORMAL HIGH (ref 1.7–7.7)
Neutrophils Relative %: 78 %
PLATELETS: 192 10*3/uL (ref 150–400)
RBC: 4.35 MIL/uL (ref 3.87–5.11)
RDW: 12.6 % (ref 11.5–15.5)
WBC: 16.3 10*3/uL — ABNORMAL HIGH (ref 4.0–10.5)

## 2016-03-28 LAB — COMPREHENSIVE METABOLIC PANEL
ALT: 19 U/L (ref 14–54)
ANION GAP: 8 (ref 5–15)
AST: 23 U/L (ref 15–41)
Albumin: 3.4 g/dL — ABNORMAL LOW (ref 3.5–5.0)
Alkaline Phosphatase: 96 U/L (ref 38–126)
BUN: 23 mg/dL — ABNORMAL HIGH (ref 6–20)
CALCIUM: 8.4 mg/dL — AB (ref 8.9–10.3)
CHLORIDE: 104 mmol/L (ref 101–111)
CO2: 26 mmol/L (ref 22–32)
Creatinine, Ser: 1.09 mg/dL — ABNORMAL HIGH (ref 0.44–1.00)
GFR, EST AFRICAN AMERICAN: 49 mL/min — AB (ref >60)
GFR, EST NON AFRICAN AMERICAN: 42 mL/min — AB (ref >60)
Glucose, Bld: 134 mg/dL — ABNORMAL HIGH (ref 65–99)
Potassium: 4.5 mmol/L (ref 3.5–5.1)
SODIUM: 138 mmol/L (ref 135–145)
Total Bilirubin: 0.5 mg/dL (ref 0.3–1.2)
Total Protein: 6.5 g/dL (ref 6.5–8.1)

## 2016-03-28 LAB — LACTIC ACID, PLASMA: LACTIC ACID, VENOUS: 2.6 mmol/L — AB (ref 0.5–1.9)

## 2016-03-28 MED ORDER — CEFTRIAXONE SODIUM 1 G IJ SOLR
1.0000 g | Freq: Once | INTRAMUSCULAR | Status: AC
  Administered 2016-03-28: 1 g via INTRAVENOUS
  Filled 2016-03-28: qty 10

## 2016-03-28 MED ORDER — ENOXAPARIN SODIUM 80 MG/0.8ML ~~LOC~~ SOLN
1.0000 mg/kg | Freq: Once | SUBCUTANEOUS | Status: AC
  Administered 2016-03-28: 70 mg via SUBCUTANEOUS
  Filled 2016-03-28: qty 0.8

## 2016-03-28 NOTE — ED Provider Notes (Signed)
[facility] DEPT Provider Note   CSN: [id number] Arrival date & time: 03/28/16  [id number]     History   Chief Complaint Chief Complaint  Patient presents with  . Leg Swelling    left    HPI Laurie Woods is a 83 y.o. female.  HPI Per EMS, patient is complaining of left lower extremity edema. Hx of dementia. Patient is coming from [facility] facility Past Medical History:  Diagnosis Date  . Anxiety   . Dementia   . Myocardial infarction    per old MI in [place].  Pt and family are unaware.  . Varicose veins     Patient Active Problem List   Diagnosis Date Noted  . Left leg cellulitis 03/28/2016  . Open wound 07/23/2011  . Postop check 07/15/2011  . Squamous cell cancer of skin of buttock 05/26/2011  . 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 LESION DESTRUCTION  07/06/2011   Procedure: DESTRUCTION LESION ANUS;  Surgeon: Gwenyth Ober, MD;  Location: [facility];  Service: General;  Laterality: N/A;  Excision of peri-anal cancer  . 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 VARICOSE VEIN SURGERY      OB History    No data available       Home Medications    Prior to Admission medications   Medication Sig Start Date End Date Taking? Authorizing Provider  citalopram (CELEXA) 20 MG tablet Take 20 mg by mouth every morning.    Yes Historical Provider, MD  donepezil (ARICEPT) 10 MG tablet Take 10 mg by mouth every morning.    Yes Historical Provider, MD  memantine (NAMENDA XR) 28 MG CP24 24 hr capsule Take 28 mg by mouth daily.   Yes Historical Provider, MD    Family History Family History  Problem Relation Age of Onset  . Heart disease Father   . Pancreatic cancer Sister   . Anesthesia problems Neg Hx     Social History Social History  Substance Use Topics  . Smoking status: Former Smoker    Years: 10.00    Quit date: 03/17/1963  . Smokeless tobacco: Never Used  . Alcohol use No   Comment: occasional     Allergies   Patient has no known allergies.   Review of Systems Review of Systems  Unable to perform ROS: Dementia  All other systems reviewed and are negative.    Physical Exam Updated Vital Signs BP 147/66 (BP Location: Right Arm)   Pulse 83   Temp 98.9 F (37.2 C)   Resp 18   Ht 5\' 5"  (1.651 m)   Wt 158 lb (71.7 kg)   SpO2 96%   BMI 26.29 kg/m   Physical Exam  Constitutional: She appears well-developed and well-nourished. No distress.  HENT:  Head: Normocephalic and atraumatic.  Eyes: Pupils are equal, round, and reactive to light.  Neck: Normal range of motion.  Cardiovascular: Normal rate and intact distal pulses.   Pulmonary/Chest: No respiratory distress.  Abdominal: Normal appearance. She exhibits no distension.  Musculoskeletal: She exhibits edema and tenderness.       Left upper leg: She exhibits tenderness and swelling.       Left lower leg: She exhibits tenderness and swelling.  Neurological: She is alert. No cranial nerve deficit.  Skin: Skin is warm and dry. No rash noted.  Psychiatric: She has a normal mood and affect.  Nursing note and vitals reviewed.  ED Treatments / Results  Labs (all labs ordered are listed, but only abnormal results are displayed) Labs Reviewed  CBC WITH DIFFERENTIAL/PLATELET - Abnormal; Notable for the following:       Result Value   WBC 16.3 (*)    Neutro Abs 12.9 (*)    Monocytes Absolute 1.8 (*)    All other components within normal limits  COMPREHENSIVE METABOLIC PANEL - Abnormal; Notable for the following:    Glucose, Bld 134 (*)    BUN 23 (*)    Creatinine, Ser 1.09 (*)    Calcium 8.4 (*)    Albumin 3.4 (*)    GFR calc non Af Amer 42 (*)    GFR calc Af Amer 49 (*)    All other components within normal limits  LACTIC ACID, PLASMA - Abnormal; Notable for the following:    Lactic Acid, Venous 2.6 (*)    All other components within normal limits  CULTURE, BLOOD (ROUTINE X 2)    CULTURE, BLOOD (ROUTINE X 2)    EKG  EKG Interpretation None       Radiology No results found.  Procedures Procedures (including critical care time)  Medications Ordered in ED Medications  cefTRIAXone (ROCEPHIN) 1 g in dextrose 5 % 50 mL IVPB (not administered)  enoxaparin (LOVENOX) injection 70 mg (70 mg Subcutaneous Given 03/28/16 2317)     Initial Impression / Assessment and Plan / ED Course  I have reviewed the triage vital signs and the nursing notes.  Pertinent labs & imaging results that were available during my care of the patient were reviewed by me and considered in my medical decision making (see chart for details)    Final Clinical Impressions(s) / ED Diagnoses   Final diagnoses:  Leg edema  Cellulitis, unspecified cellulitis site    New Prescriptions New Prescriptions   No medications on file     Leonard Schwartz, MD 03/28/16 2334

## 2016-03-28 NOTE — ED Notes (Signed)
ED Provider at bedside. 

## 2016-03-28 NOTE — ED Notes (Addendum)
Per Daughter Nedda Fripp please call sister Seymone Blew if mother has any complications or need someone to come back to hospital [phone]. Daughter is taking is taking mother's wedding rings and mother's birthstone rings.

## 2016-03-28 NOTE — H&P (Signed)
Laurie SEDGWICK [id number] DOB: July 10, 1919 DOA: 03/28/2016     PCP: Geoffery Lyons, MD   Outpatient Specialists: [facility]  Patient coming from:  From facility [facility] facility.  Chief Complaint: Leg edema  HPI: Laurie Woods is a 83 y.o. female with medical history significant of dementia  Peri-anal skin cancer  Presented with 2 day history of severe left lower extremity swelling patient has significant dementia may unable to provide further history. Patient has so denies any shortness of breath or chest pain. Denies any pain lower extremity did not endorse any history of fevers or chills she is unsure what's making the swelling worse or better.   Regarding pertinent Chronic problems: As per review of records Family states that while she was living in [place] patient had an EKG done which showed old MI but they were not aware of any history of coronary artery disease   IN ER:  Temp (24hrs), Avg:98.9 F (37.2 C), Min:98.9 F (37.2 C), Max:98.9 F (37.2 C)      RR 18 satting 96% HR 83 BP 147/66 Lactic acid 2.16 WBC 16.3 Creatinine 1.09 which is baseline .  Following Medications were ordered in ER: Medications  cefTRIAXone (ROCEPHIN) 1 g in dextrose 5 % 50 mL IVPB (not administered)  enoxaparin (LOVENOX) injection 70 mg (70 mg Subcutaneous Given 03/28/16 2317)       Hospitalist was called for admission for presume cellulitis of left lower extremity versus DVT  Review of Systems:    Pertinent positives include: leg swelling  Constitutional:  No weight loss, night sweats, Fevers, chills, fatigue, weight loss  HEENT:  No headaches, Difficulty swallowing,Tooth/dental problems,Sore throat,  No sneezing, itching, ear ache, nasal congestion, post nasal drip,  Cardio-vascular:  No chest pain, Orthopnea, PND, anasarca, dizziness, palpitations.no Bilateral lower extremity swelling  GI:  No heartburn, indigestion, abdominal pain, nausea, vomiting,  diarrhea, change in bowel habits, loss of appetite, melena, blood in stool, hematemesis Resp:  no shortness of breath at rest. No dyspnea on exertion, No excess mucus, no productive cough, No non-productive cough, No coughing up of blood.No change in color of mucus.No wheezing. Skin:  no rash or lesions. No jaundice GU:  no dysuria, change in color of urine, no urgency or frequency. No straining to urinate.  No flank pain.  Musculoskeletal:  No joint pain or no joint swelling. No decreased range of motion. No back pain.  Psych:  No change in mood or affect. No depression or anxiety. No memory loss.  Neuro: no localizing neurological complaints, no tingling, no weakness, no double vision, no gait abnormality, no slurred speech, no confusion  As per HPI otherwise 10 point review of systems negative.   Past Medical History: Past Medical History:  Diagnosis Date  . Anxiety   . Dementia   . Myocardial infarction    per old MI in [place].  Pt and family are unaware.  . Varicose veins    Past Surgical History:  Procedure Laterality Date  . LESION DESTRUCTION  07/06/2011   Procedure: DESTRUCTION LESION ANUS;  Surgeon: Gwenyth Ober, MD;  Location: [facility];  Service: General;  Laterality: N/A;  Excision of peri-anal cancer  . 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 VARICOSE VEIN SURGERY       Social History:       reports that she quit smoking about 53 years ago. She quit after 10.00 years of use. She has never used smokeless tobacco. She  reports that she does not drink alcohol or use drugs.  Allergies:  No Known Allergies     Family History:   Family History  Problem Relation Age of Onset  . Heart disease Father   . Pancreatic cancer Sister   . Anesthesia problems Neg Hx     Medications: Prior to Admission medications   Medication Sig Start Date End Date Taking? Authorizing Provider  citalopram (CELEXA) 20 MG tablet Take 20 mg by mouth every morning.    Yes Historical  Provider, MD  donepezil (ARICEPT) 10 MG tablet Take 10 mg by mouth every morning.    Yes Historical Provider, MD  memantine (NAMENDA XR) 28 MG CP24 24 hr capsule Take 28 mg by mouth daily.   Yes Historical Provider, MD    Physical Exam: Patient Vitals for the past 24 hrs:  BP Temp Pulse Resp SpO2 Height Weight  03/28/16 2158 147/66 - 83 18 96 % - -  03/28/16 1846 - - - - - 5\' 5"  (1.651 m) 71.7 kg (158 lb)  03/28/16 1844 164/68 98.9 F (37.2 C) 87 18 95 % - -    1. General:  in No Acute distress 2. Psychological: Alert and  Oriented 3. Head/ENT:   Dry Mucous Membranes                          Head Non traumatic, neck supple                           Poor Dentition 4. SKIN:  decreased Skin turgor,  Skin clean Dry and intact no rash 5. Heart: Regular rate and rhythm no Murmur, Rub or gallop 6. Lungs:  Clear to auscultation bilaterally, no wheezes or crackles   7. Abdomen: Soft,  non-tender, Non distended 8. Lower extremities: no clubbing, cyanosis,   Edema redness left leg Up to the thigh no well demarcated area of redness.        9. Neurologically Grossly intact, moving all 4 extremities equally  10. MSK: Normal range of motion   body mass index is 26.29 kg/m.  Labs on Admission:   Labs on Admission: I have personally reviewed following labs and imaging studies  CBC:  Recent Labs Lab 03/28/16 2049  WBC 16.3*  NEUTROABS 12.9*  HGB 13.6  HCT 41.2  MCV 94.7  PLT [id number]   Basic Metabolic Panel:  Recent Labs Lab 03/28/16 2049  NA 138  K 4.5  CL 104  CO2 26  GLUCOSE 134*  BUN 23*  CREATININE 1.09*  CALCIUM 8.4*   GFR: Estimated Creatinine Clearance: 31.3 mL/min (by C-G formula based on SCr of 1.09 mg/dL (H)). Liver Function Tests:  Recent Labs Lab 03/28/16 2049  AST 23  ALT 19  ALKPHOS 96  BILITOT 0.5  PROT 6.5  ALBUMIN 3.4*   No results for input(s): LIPASE, AMYLASE in the last 168 hours. No results for input(s): AMMONIA in the last 168  hours. Coagulation Profile: No results for input(s): INR, PROTIME in the last 168 hours. Cardiac Enzymes: No results for input(s): CKTOTAL, CKMB, CKMBINDEX, TROPONINI in the last 168 hours. BNP (last 3 results) No results for input(s): PROBNP in the last 8760 hours. HbA1C: No results for input(s): HGBA1C in the last 72 hours. CBG: No results for input(s): GLUCAP in the last 168 hours. Lipid Profile: No results for input(s): CHOL, HDL, LDLCALC, TRIG, CHOLHDL, LDLDIRECT in the last  72 hours. Thyroid Function Tests: No results for input(s): TSH, T4TOTAL, FREET4, T3FREE, THYROIDAB in the last 72 hours. Anemia Panel: No results for input(s): VITAMINB12, FOLATE, FERRITIN, TIBC, IRON, RETICCTPCT in the last 72 hours. Urine analysis: No results found for: COLORURINE, APPEARANCEUR, LABSPEC, PHURINE, GLUCOSEU, HGBUR, BILIRUBINUR, KETONESUR, PROTEINUR, UROBILINOGEN, NITRITE, LEUKOCYTESUR Sepsis Labs: @LABRCNTIP (procalcitonin:4,lacticidven:4) )No results found for this or any previous visit (from the past 240 hour(s)).     UA  ordered  No results found for: HGBA1C  Estimated Creatinine Clearance: 31.3 mL/min (by C-G formula based on SCr of 1.09 mg/dL (H)).  BNP (last 3 results) No results for input(s): PROBNP in the last 8760 hours.   ECG REPORT   ordered  [facility] Weights   03/28/16 1846  Weight: 71.7 kg (158 lb)     Cultures: No results found for: SDES, SPECREQUEST, CULT, REPTSTATUS   Radiological Exams on Admission: No results found.  Chart has been reviewed    Assessment/Plan  83 y.o. female with medical history significant of dementia  peri-anal skin cancer admited for left lower extremity cellulitis vs DVT  Present on Admission: . Left leg cellulitis- leg appears to be warm and red recently with elevated white blood cell contrast raises possibility of infectious process will obtain plain films to evaluate for any air formation. Patient nontoxic appearing. Initiate  antibiotics check for DVT . Leg edema markedly edema suspect E due to severe cellulitis or extensive DVT. Empirically started on Lovenox. Obtain Dopplers tomorrow. Patient not endorsing shortness of breath or chest pain. We'll observe on telemetry given that is a possibility of large DVT which could result in PE. We'll empirically treat for now  Dementia expect some degree of sundowning while hospitalized Other plan as per orders.  DVT prophylaxis:    Lovenox     Code Status:  DNR/DNIas per paperwork  Family Communication:   Family not  at  Bedside    Disposition Plan:                             Back to current facility when stable                                                      Social Work  consulted                          Consults called:none  Admission status:    Inpatient    Level of care     medical floor        I have spent a total of 56 min on this admission  Divine Hansley 03/29/2016, 12:05 AM    [facility]  Pager [phone]   after 2 AM please page floor coverage PA If 7AM-7PM, please contact the day team taking care of the patient  [email or web address]  Password [email or web address]

## 2016-03-28 NOTE — ED Notes (Signed)
Patient denies pain and is resting comfortably.  

## 2016-03-28 NOTE — ED Notes (Signed)
Bed: [id number] Expected date:  Expected time:  Means of arrival:  Comments: 83 yo LLE edema

## 2016-03-28 NOTE — ED Triage Notes (Signed)
Per EMS, patient is complaining of left lower extremity edema. Hx of dementia. Patient is coming from [facility] facility.

## 2016-03-29 ENCOUNTER — Inpatient Hospital Stay (HOSPITAL_COMMUNITY): Payer: Medicare Other

## 2016-03-29 ENCOUNTER — Encounter (HOSPITAL_COMMUNITY): Payer: Self-pay

## 2016-03-29 DIAGNOSIS — M79609 Pain in unspecified limb: Secondary | ICD-10-CM

## 2016-03-29 DIAGNOSIS — I2699 Other pulmonary embolism without acute cor pulmonale: Secondary | ICD-10-CM

## 2016-03-29 DIAGNOSIS — M7989 Other specified soft tissue disorders: Secondary | ICD-10-CM

## 2016-03-29 LAB — COMPREHENSIVE METABOLIC PANEL
ALT: 19 U/L (ref 14–54)
AST: 19 U/L (ref 15–41)
Albumin: 3.1 g/dL — ABNORMAL LOW (ref 3.5–5.0)
Alkaline Phosphatase: 80 U/L (ref 38–126)
Anion gap: 5 (ref 5–15)
BILIRUBIN TOTAL: 0.7 mg/dL (ref 0.3–1.2)
BUN: 20 mg/dL (ref 6–20)
CHLORIDE: 106 mmol/L (ref 101–111)
CO2: 30 mmol/L (ref 22–32)
Calcium: 8.4 mg/dL — ABNORMAL LOW (ref 8.9–10.3)
Creatinine, Ser: 1.08 mg/dL — ABNORMAL HIGH (ref 0.44–1.00)
GFR, EST AFRICAN AMERICAN: 49 mL/min — AB (ref >60)
GFR, EST NON AFRICAN AMERICAN: 43 mL/min — AB (ref >60)
Glucose, Bld: 124 mg/dL — ABNORMAL HIGH (ref 65–99)
POTASSIUM: 4.2 mmol/L (ref 3.5–5.1)
Sodium: 141 mmol/L (ref 135–145)
TOTAL PROTEIN: 6.3 g/dL — AB (ref 6.5–8.1)

## 2016-03-29 LAB — CBC
HEMATOCRIT: 40.3 % (ref 36.0–46.0)
Hemoglobin: 13.2 g/dL (ref 12.0–15.0)
MCH: 31 pg (ref 26.0–34.0)
MCHC: 32.8 g/dL (ref 30.0–36.0)
MCV: 94.6 fL (ref 78.0–100.0)
PLATELETS: 193 10*3/uL (ref 150–400)
RBC: 4.26 MIL/uL (ref 3.87–5.11)
RDW: 12.7 % (ref 11.5–15.5)
WBC: 14.1 10*3/uL — AB (ref 4.0–10.5)

## 2016-03-29 LAB — TROPONIN I
Troponin I: <0.03 ng/mL (ref <0.03)
Troponin I: <0.03 ng/mL (ref <0.03)

## 2016-03-29 LAB — ECHOCARDIOGRAM COMPLETE
HEIGHTINCHES: 65 in
WEIGHTICAEL: 2645.52 [oz_av]

## 2016-03-29 LAB — TSH: TSH: 1.43 u[IU]/mL (ref 0.350–4.500)

## 2016-03-29 LAB — MAGNESIUM: MAGNESIUM: 2.2 mg/dL (ref 1.7–2.4)

## 2016-03-29 LAB — URIC ACID: URIC ACID, SERUM: 5.4 mg/dL (ref 2.3–6.6)

## 2016-03-29 LAB — PHOSPHORUS: PHOSPHORUS: 2.7 mg/dL (ref 2.5–4.6)

## 2016-03-29 LAB — D-DIMER, QUANTITATIVE: D-Dimer, Quant: >20 ug/mL-FEU — ABNORMAL HIGH (ref 0.00–0.50)

## 2016-03-29 LAB — MRSA PCR SCREENING: MRSA by PCR: NEGATIVE

## 2016-03-29 MED ORDER — ONDANSETRON HCL 4 MG/2ML IJ SOLN: 4.0000 mg | Freq: Four times a day (QID) | INTRAMUSCULAR | Status: DC | PRN

## 2016-03-29 MED ORDER — COUMADIN BOOK
Freq: Once | Status: AC
  Administered 2016-03-29: 18:00:00
  Filled 2016-03-29: qty 1

## 2016-03-29 MED ORDER — CITALOPRAM HYDROBROMIDE 20 MG PO TABS
20.0000 mg | ORAL_TABLET | Freq: Every morning | ORAL | Status: DC
  Administered 2016-03-29 – 2016-04-05 (×8): 20 mg via ORAL
  Filled 2016-03-29 (×8): qty 1

## 2016-03-29 MED ORDER — ACETAMINOPHEN 325 MG PO TABS
650.0000 mg | ORAL_TABLET | Freq: Four times a day (QID) | ORAL | Status: DC | PRN
  Administered 2016-04-01 – 2016-04-04 (×2): 650 mg via ORAL
  Filled 2016-03-29 (×2): qty 2

## 2016-03-29 MED ORDER — ONDANSETRON HCL 4 MG PO TABS: 4.0000 mg | ORAL_TABLET | Freq: Four times a day (QID) | ORAL | Status: DC | PRN

## 2016-03-29 MED ORDER — CEFAZOLIN SODIUM-DEXTROSE 2-4 GM/100ML-% IV SOLN
2.0000 g | Freq: Two times a day (BID) | INTRAVENOUS | Status: DC
  Administered 2016-03-29: 2 g via INTRAVENOUS
  Filled 2016-03-29 (×2): qty 100

## 2016-03-29 MED ORDER — MEMANTINE HCL ER 28 MG PO CP24
28.0000 mg | ORAL_CAPSULE | Freq: Every day | ORAL | Status: DC
  Administered 2016-03-29 – 2016-04-05 (×8): 28 mg via ORAL
  Filled 2016-03-29 (×8): qty 1

## 2016-03-29 MED ORDER — ENOXAPARIN SODIUM 80 MG/0.8ML ~~LOC~~ SOLN
70.0000 mg | Freq: Two times a day (BID) | SUBCUTANEOUS | Status: DC
  Administered 2016-03-29 – 2016-04-04 (×14): 70 mg via SUBCUTANEOUS
  Filled 2016-03-29 (×12): qty 0.8

## 2016-03-29 MED ORDER — WARFARIN VIDEO: Freq: Once | Status: DC

## 2016-03-29 MED ORDER — HYDROCODONE-ACETAMINOPHEN 5-325 MG PO TABS: 1.0000 | ORAL_TABLET | ORAL | Status: DC | PRN

## 2016-03-29 MED ORDER — WARFARIN SODIUM 4 MG PO TABS
4.0000 mg | ORAL_TABLET | Freq: Once | ORAL | Status: AC
  Administered 2016-03-29: 4 mg via ORAL
  Filled 2016-03-29: qty 1

## 2016-03-29 MED ORDER — IOPAMIDOL (ISOVUE-370) INJECTION 76%
100.0000 mL | Freq: Once | INTRAVENOUS | Status: AC | PRN
  Administered 2016-03-29: 100 mL via INTRAVENOUS

## 2016-03-29 MED ORDER — SODIUM CHLORIDE 0.9 % IV SOLN
INTRAVENOUS | Status: AC
  Administered 2016-03-29: 01:00:00 via INTRAVENOUS

## 2016-03-29 MED ORDER — WARFARIN - PHARMACIST DOSING INPATIENT: Freq: Every day | Status: DC

## 2016-03-29 MED ORDER — DONEPEZIL HCL 5 MG PO TABS
10.0000 mg | ORAL_TABLET | Freq: Every morning | ORAL | Status: DC
  Administered 2016-03-29 – 2016-04-05 (×8): 10 mg via ORAL
  Filled 2016-03-29 (×8): qty 2

## 2016-03-29 MED ORDER — ACETAMINOPHEN 650 MG RE SUPP: 650.0000 mg | Freq: Four times a day (QID) | RECTAL | Status: DC | PRN

## 2016-03-29 MED ORDER — SODIUM CHLORIDE 0.9% FLUSH
3.0000 mL | Freq: Two times a day (BID) | INTRAVENOUS | Status: DC
  Administered 2016-03-29 – 2016-04-05 (×14): 3 mL via INTRAVENOUS

## 2016-03-29 MED ORDER — IOPAMIDOL (ISOVUE-370) INJECTION 76%
INTRAVENOUS | Status: AC
  Filled 2016-03-29: qty 100

## 2016-03-29 NOTE — Progress Notes (Signed)
Results of CTA came back with bilateral saddle emboli and signs of right heart strain.  Code PE activated.  Patient already on lovenox and currently is pain free and hemodynamically stable.  Discussed with Dr. Lake Bells- patient a poor candidate for tPA given her age and hemodynamic stability.  Will continue lovenox at treatment dose and start Coumadin.  Daughter, Collie Siad, notified by phone.

## 2016-03-29 NOTE — Progress Notes (Addendum)
VASCULAR LAB PRELIMINARY  PRELIMINARY  PRELIMINARY  PRELIMINARY  Bilateral lower extremity venous duplex completed.    Preliminary report:  There is acute DVT noted at the saphenofemoral junction, and in the common femoral, femoral, popliteal, peroneal, and posterior tibial veins of the left lower extremity.  There is no propagation noted to the right lower extremity.  Attempted to visualize iliac/IVC, however, this was technically limited and difficult secondary to body habitus and gas.   Gave report to [place], RN  Mauro Kaufmann, [place], RVT 03/29/2016, 10:54 AM

## 2016-03-29 NOTE — Progress Notes (Signed)
  Echocardiogram 2D Echocardiogram has been performed.  Jennette Dubin 03/29/2016, 3:10 PM

## 2016-03-29 NOTE — Progress Notes (Signed)
PHARMACY NOTE:  ANTIMICROBIAL RENAL DOSAGE ADJUSTMENT  Current antimicrobial regimen includes a mismatch between antimicrobial dosage and estimated renal function.  As per policy approved by the Pharmacy & Therapeutics and Medical Executive Committees, the antimicrobial dosage will be adjusted accordingly.  Current antimicrobial dosage:  Ancef 2 Gm IV q8h  Indication: cellulitis  Renal Function: CrCl~31 ml/min Estimated Creatinine Clearance: 31.3 mL/min (by C-G formula based on SCr of 1.09 mg/dL (H)). []      On intermittent HD, scheduled: []      On CRRT    Antimicrobial dosage has been changed to:  Ancef 2 Gm IV q12h  Thank you for allowing pharmacy to be a part of this patient's care.  Dorrene German, [facility] 03/29/2016 1:05 AM

## 2016-03-29 NOTE — Progress Notes (Signed)
Pt's facility assisted with admission process. Pt from [facility] [phone].

## 2016-03-29 NOTE — Progress Notes (Signed)
ANTICOAGULATION CONSULT NOTE  Pharmacy Consult for Warfarin, Lovenox Indication: DVT  No Known Allergies  Patient Measurements: Height: 5\' 5"  (165.1 cm) Weight: 165 lb 5.5 oz (75 kg) IBW/kg (Calculated) : 57  Vital Signs: Temp: 99 F (37.2 C) (01/14 0059) Temp Source: Oral (01/14 0059) BP: 156/75 (01/14 0059) Pulse Rate: 80 (01/14 0059)  Labs:  Recent Labs  03/28/16 2049 03/29/16 0017 03/29/16 0544 03/29/16 1001  HGB 13.6  --  13.2  --   HCT 41.2  --  40.3  --   PLT 192  --  193  --   CREATININE 1.09*  --  1.08*  --   TROPONINI  --  <0.03 <0.03 <0.03    Estimated Creatinine Clearance: 32.3 mL/min (by C-G formula based on SCr of 1.08 mg/dL (H)).   Medications:  Scheduled:  . iopamidol      .  ceFAZolin (ANCEF) IV  2 g Intravenous Q12H  . citalopram  20 mg Oral q morning - 10a  . donepezil  10 mg Oral q morning - 10a  . enoxaparin (LOVENOX) injection  70 mg Subcutaneous Q12H  . memantine  28 mg Oral Daily  . sodium chloride flush  3 mL Intravenous Q12H    Assessment: 89 yoF admitted on 1/13 with hx of dementia and peri-anal skin cancer now with severe LLE swelling.  Pharmacy is consulted for Lovenox dosing for r/o DVT on 1/13.  Pharmacy is consulted for warfarin dosing on 1/14.  Today, 03/29/2016:  INR: pending  CBC: Hgb and Plt stable/WNL  SCr 1.08 with CrCl ~ 32 ml/min  Diet: heart healthy  No major drug-drug interactions noted.   Goal of Therapy:  INR 2-3 Anti-Xa level 0.6-1 units/ml 4hrs after LMWH dose given Monitor platelets by anticoagulation protocol: Yes   Plan:   Continue Lovenox 70 mg SQ q12h.  Warfarin 4 mg PO x 1 at 1800  Daily PT/INR.  Monitor for signs and symptoms of bleeding.  Pharmacy to provide warfarin education prior to discharge.  Warfarin book and video ordered.  Pt with hx dementia, but will try to educate family if available at bedside.  She will need a minimum of 5 days warfarin / Lovenox overlap and until INR > 2  for 24 hours.   Gretta Arab PharmD, BCPS Pager [phone] 03/29/2016 12:50 PM

## 2016-03-29 NOTE — Progress Notes (Signed)
PROGRESS NOTE    Laurie Woods  [id number] DOB: 1919-12-08 DOA: 03/28/2016 PCP: Geoffery Lyons, MD    Brief Narrative:  Laurie Woods is a 83 y.o. female with medical history significant of dementia  Peri-anal skin cancer. Presented with 2 day history of severe left lower extremity swelling patient has significant dementia may unable to provide further history. Patient has so denies any shortness of breath or chest pain. Denies any pain lower extremity did not endorse any history of fevers or chills she is unsure what's making the swelling worse or better.   Regarding pertinent Chronic problems: As per review of records Family states that while she was living in [place] patient had an EKG done which showed old MI but they were not aware of any history of coronary artery disease   IN ER:  Temp (24hrs), Avg:98.9 F (37.2 C), Min:98.9 F (37.2 C), Max:98.9 F (37.2 C) RR 18 satting 96% HR 83 BP 147/66 lactic acid 2.16 WBC 16.3 Creatinine 1.09 which is baseline .   Assessment & Plan:   Active Problems:   Left leg cellulitis   Leg edema   . Left leg cellulitis - leg still warm and red - vascular U/S positive for extensive DVT up to iliac vein - discussed with vascular surgery- no intervention at this time  - already on treatment dose lovenox; will start coumadin per pharmacy - monitor INR's daily - CTA pending  . Leg edema - likely due to DVT - patient on treatment dose lovenox - to start coumadin tonight - can discontinue IV antibiotics    Dementia - expect some degree of sundowning while hospitalized - redirection - continue aricept and namenda  DVT prophylaxis:    Lovenox and coumadin  Code Status:  DNR/DNI as per paperwork  Family Communication:   Daughter is bedside, spoke with other daughter on the phone  Disposition Plan:   Back to current facility when stable - likely when INR is stable     Consultants:   None  Procedures:    None  Antimicrobials:   Ancef  Rocephin    Subjective: Patient sitting in bed.  Poor short term memory.  Discussed with patient that she may have a blood clot in her leg or an infection.  She asked many of the same questions repeatedly.  Daughter is bedside.   Objective: Vitals:   03/28/16 2158 03/29/16 0012 03/29/16 0015 03/29/16 0059  BP: 147/66 155/69 155/69 (!) 156/75  Pulse: 83 85 87 80  Resp: 18 18 19 18   Temp:    99 F (37.2 C)  TempSrc:    Oral  SpO2: 96% 90% 93% 99%  Weight:    75 kg (165 lb 5.5 oz)  Height:    5\' 5"  (1.651 m)    Intake/Output Summary (Last 24 hours) at 03/29/16 1242 Last data filed at 03/29/16 1000  Gross per 24 hour  Intake           976.25 ml  Output                0 ml  Net           976.25 ml   Filed Weights   03/28/16 1846 03/29/16 0059  Weight: 71.7 kg (158 lb) 75 kg (165 lb 5.5 oz)    Examination:  General exam: Appears calm and comfortable  Respiratory system: Clear to auscultation. Respiratory effort normal. Cardiovascular system: S1 & S2 heard, RRR. No JVD, murmurs, rubs,  gallops or clicks. No pedal edema. Gastrointestinal system: Abdomen is nondistended, soft and nontender. No organomegaly or masses felt. Normal bowel sounds heard. Central nervous system: Alert and oriented. No focal neurological deficits. Extremities: left lower extremity swollen with some erythema and tenderness Skin: No rashes, lesions or ulcers Psychiatry: Mood & affect appropriate. Limited insight    Data Reviewed: I have personally reviewed following labs and imaging studies  CBC:  Recent Labs Lab 03/28/16 2049 03/29/16 0544  WBC 16.3* 14.1*  NEUTROABS 12.9*  --   HGB 13.6 13.2  HCT 41.2 40.3  MCV 94.7 94.6  PLT 192 0000000   Basic Metabolic Panel:  Recent Labs Lab 03/28/16 2049 03/29/16 0544  NA 138 141  K 4.5 4.2  CL 104 106  CO2 26 30  GLUCOSE 134* 124*  BUN 23* 20  CREATININE 1.09* 1.08*  CALCIUM 8.4* 8.4*  MG  --  2.2   PHOS  --  2.7   GFR: Estimated Creatinine Clearance: 32.3 mL/min (by C-G formula based on SCr of 1.08 mg/dL (H)). Liver Function Tests:  Recent Labs Lab 03/28/16 2049 03/29/16 0544  AST 23 19  ALT 19 19  ALKPHOS 96 80  BILITOT 0.5 0.7  PROT 6.5 6.3*  ALBUMIN 3.4* 3.1*   No results for input(s): LIPASE, AMYLASE in the last 168 hours. No results for input(s): AMMONIA in the last 168 hours. Coagulation Profile: No results for input(s): INR, PROTIME in the last 168 hours. Cardiac Enzymes:  Recent Labs Lab 03/29/16 0017 03/29/16 0544 03/29/16 1001  TROPONINI <0.03 <0.03 <0.03   BNP (last 3 results) No results for input(s): PROBNP in the last 8760 hours. HbA1C: No results for input(s): HGBA1C in the last 72 hours. CBG: No results for input(s): GLUCAP in the last 168 hours. Lipid Profile: No results for input(s): CHOL, HDL, LDLCALC, TRIG, CHOLHDL, LDLDIRECT in the last 72 hours. Thyroid Function Tests:  Recent Labs  03/29/16 0544  TSH 1.430   Anemia Panel: No results for input(s): VITAMINB12, FOLATE, FERRITIN, TIBC, IRON, RETICCTPCT in the last 72 hours. Sepsis Labs:  Recent Labs Lab 03/28/16 2057  LATICACIDVEN 2.6*    Recent Results (from the past 240 hour(s))  MRSA PCR Screening     Status: None   Collection Time: 03/29/16  1:24 AM  Result Value Ref Range Status   MRSA by PCR NEGATIVE NEGATIVE Final    Comment:        The GeneXpert MRSA Assay (FDA approved for NASAL specimens only), is one component of a comprehensive MRSA colonization surveillance program. It is not intended to diagnose MRSA infection nor to guide or monitor treatment for MRSA infections.          Radiology Studies: Dg Tibia/fibula Left  Result Date: 03/29/2016 CLINICAL DATA:  Left lower extremity edema EXAM: LEFT TIBIA AND FIBULA - 2 VIEW COMPARISON:  None. FINDINGS: There is no evidence of fracture or other focal bone lesions. There are vascular calcifications.  Mild-to-moderate soft tissue edema. IMPRESSION: Mild-to-moderate soft tissue edema without osseous abnormality. Electronically Signed   By: Ulyses Jarred M.D.   On: 03/29/2016 01:04        Scheduled Meds: . iopamidol      .  ceFAZolin (ANCEF) IV  2 g Intravenous Q12H  . citalopram  20 mg Oral q morning - 10a  . donepezil  10 mg Oral q morning - 10a  . enoxaparin (LOVENOX) injection  70 mg Subcutaneous Q12H  . memantine  28 mg  Oral Daily  . sodium chloride flush  3 mL Intravenous Q12H   Continuous Infusions:   LOS: 1 day    Time spent: 35 minutes    Loretha Stapler, MD [facility] Pager [phone]  If 7PM-7AM, please contact night-coverage [email or web address] Password [facility] 03/29/2016, 12:42 PM

## 2016-03-29 NOTE — Progress Notes (Signed)
Dr Adair Patter made aware of LLE duplex results.  Aware dtr is at bedside.  Will see pt shortly.

## 2016-03-29 NOTE — Progress Notes (Signed)
ANTICOAGULATION CONSULT NOTE - Initial Consult  Pharmacy Consult for Lovenox Indication: VTE treatment (R/o DVT)  No Known Allergies  Patient Measurements: Height: 5\' 5"  (165.1 cm) Weight: 158 lb (71.7 kg) IBW/kg (Calculated) : 57   Vital Signs: Temp: 99 F (37.2 C) (01/14 0059) Temp Source: Oral (01/14 0059) BP: 156/75 (01/14 0059) Pulse Rate: 80 (01/14 0059)  Labs:  Recent Labs  03/28/16 2049 03/29/16 0017  HGB 13.6  --   HCT 41.2  --   PLT 192  --   CREATININE 1.09*  --   TROPONINI  --  <0.03    Estimated Creatinine Clearance: 31.3 mL/min (by C-G formula based on SCr of 1.09 mg/dL (H)).   Medical History: Past Medical History:  Diagnosis Date  . Anxiety   . Dementia   . Myocardial infarction    per old MI in [place].  Pt and family are unaware.  . Varicose veins     Medications:  Scheduled:  .  ceFAZolin (ANCEF) IV  2 g Intravenous Q12H  . citalopram  20 mg Oral q morning - 10a  . donepezil  10 mg Oral q morning - 10a  . memantine  28 mg Oral Daily  . sodium chloride flush  3 mL Intravenous Q12H   Infusions:  . sodium chloride 75 mL/hr at 03/29/16 0101    Assessment: 76 yoF with hx of dementia and peri-anal skin cancer now with severe LLE swelling. Lovenox per Rx for r/o DVT.   Goal of Therapy:  Anti-Xa level 0.6-1 units/ml 4hrs after LMWH dose given    Plan:  Lovenox 70 mg sq q12h F/u doppler results F/u Scr   Lawana Pai R 03/29/2016,1:12 AM

## 2016-03-30 DIAGNOSIS — I82492 Acute embolism and thrombosis of other specified deep vein of left lower extremity: Secondary | ICD-10-CM

## 2016-03-30 DIAGNOSIS — I2692 Saddle embolus of pulmonary artery without acute cor pulmonale: Secondary | ICD-10-CM

## 2016-03-30 LAB — HEMOGLOBIN A1C
HEMOGLOBIN A1C: 5.4 % (ref 4.8–5.6)
Mean Plasma Glucose: 108 mg/dL

## 2016-03-30 LAB — GLUCOSE, CAPILLARY: Glucose-Capillary: 93 mg/dL (ref 65–99)

## 2016-03-30 LAB — PROTIME-INR
INR: 1.15
PROTHROMBIN TIME: 14.8 s (ref 11.4–15.2)

## 2016-03-30 MED ORDER — WARFARIN SODIUM 4 MG PO TABS
4.0000 mg | ORAL_TABLET | Freq: Once | ORAL | Status: AC
  Administered 2016-03-30: 4 mg via ORAL
  Filled 2016-03-30: qty 1

## 2016-03-30 NOTE — Progress Notes (Addendum)
ANTICOAGULATION CONSULT NOTE  Pharmacy Consult for Warfarin, Lovenox Indication: new pulmonary embolus and DVT  No Known Allergies  Patient Measurements: Height: 5\' 5"  (165.1 cm) Weight: 165 lb 5.5 oz (75 kg) IBW/kg (Calculated) : 57  Vital Signs: Temp: 98.3 F (36.8 C) (01/15 0500) Temp Source: Oral (01/15 0500) BP: 150/64 (01/15 0500) Pulse Rate: 81 (01/15 0500)  Labs:  Recent Labs  03/28/16 2049 03/29/16 0017 03/29/16 0544 03/29/16 1001 03/30/16 0532  HGB 13.6  --  13.2  --   --   HCT 41.2  --  40.3  --   --   PLT 192  --  193  --   --   LABPROT  --   --   --   --  14.8  INR  --   --   --   --  1.15  CREATININE 1.09*  --  1.08*  --   --   TROPONINI  --  <0.03 <0.03 <0.03  --     Estimated Creatinine Clearance: 32.3 mL/min (by C-G formula based on SCr of 1.08 mg/dL (H)).   Medications:  Scheduled:  . citalopram  20 mg Oral q morning - 10a  . donepezil  10 mg Oral q morning - 10a  . enoxaparin (LOVENOX) injection  70 mg Subcutaneous Q12H  . memantine  28 mg Oral Daily  . sodium chloride flush  3 mL Intravenous Q12H  . warfarin   Does not apply Once  . Warfarin - Pharmacist Dosing Inpatient   Does not apply q1800    Assessment: 38 yoF admitted on 1/13 with hx of dementia and peri-anal skin cancer now with severe LLE swelling.  She's currently on lovenox and warfarin for new DVT/PE.  - 1/14 Vascular US: positive for LLE DVT - 1/14 CTa: positive for saddle emboli at the bifurcations of the LEFT and RIGHT pulmonary arteries, with thrombus extending into RIGHT upper lobe and LEFT lower lobe.  Not a tPA candidate per PCCM.   Today, 03/30/2016:  Anticoagulation overlap day #2 of 5 days minimum  INR: 1.15  CBC: Hgb and Plt stable/WNL  SCr 1.08 with CrCl ~ 32 ml/min  Diet: heart healthy  No major drug-drug interactions noted.   Goal of Therapy:  INR 2-3 Anti-Xa level 0.6-1 units/ml 4hrs after LMWH dose given Monitor platelets by anticoagulation  protocol: Yes   Plan:   Continue Lovenox 70 mg SQ q12h.  Repeat Warfarin 4 mg PO x 1 at 1800  Monitor for signs and symptoms of bleeding.  Educated patient's daughter on warfarin on 03/30/16  Dia Sitter, PharmD, BCPS 03/30/2016 7:32 AM

## 2016-03-30 NOTE — Discharge Instructions (Addendum)

## 2016-03-30 NOTE — Progress Notes (Signed)
PROGRESS NOTE    Laurie Woods  [id number] DOB: Apr 26, 1919 DOA: 03/28/2016 PCP: Geoffery Lyons, MD    Brief Narrative:  Laurie Woods is a 83 y.o. female with medical history significant of dementia  Peri-anal skin cancer. Presented with 2 day history of severe left lower extremity swelling patient has significant dementia may unable to provide further history. Patient has so denies any shortness of breath or chest pain. Denies any pain lower extremity did not endorse any history of fevers or chills she is unsure what's making the swelling worse or better.   Regarding pertinent Chronic problems: As per review of records Family states that while she was living in [place] patient had an EKG done which showed old MI but they were not aware of any history of coronary artery disease   IN ER:  Temp (24hrs), Avg:98.9 F (37.2 C), Min:98.9 F (37.2 C), Max:98.9 F (37.2 C) RR 18 satting 96% HR 83 BP 147/66 lactic acid 2.16 WBC 16.3 Creatinine 1.09 which is baseline .   Patient underwent lower extremity doppler and was found to have extensive DVT of left leg extending to left iliac vein.  CTA showing bilateral saddle emboli and signs of right heart strain.  Echocardiogram ordered and performed and did not show signs of right heart strain.  Patient started on coumadin with lovenox bridge.  Family updated.    Assessment & Plan:   Active Problems:   Left leg cellulitis   Leg edema   . Left leg cellulitis - leg warm and red - vascular U/S positive for extensive DVT up to iliac vein - discussed with vascular surgery- no intervention at this time  - already on treatment dose lovenox; will start coumadin per pharmacy - monitor INR's daily - CTA showing bilateral saddle emboli and signs of right heart strain - echocardiogram not showing right heart strain - telemetry discontinued 2/2 agitation and family preference  . Leg edema - likely due to DVT - patient on treatment dose  lovenox - to start coumadin tonight  Dementia - expect some degree of sundowning while hospitalized - redirection - continue aricept and namenda - will try to keep room dark when nighttime and avoid nighttime awakenings  DVT prophylaxis:    Lovenox and coumadin  Code Status:  DNR/DNI as per paperwork  Family Communication:  no family at bedside today  Disposition Plan:   Back to current facility when stable - likely when INR therapeutic   Consultants:   None  Procedures:   None  Antimicrobials:   Ancef  Rocephin    Subjective: Patient sitting in bed.  Poor short term memory.  She asked repeatedly why she was in the hospital and how she got a blood clot in her leg and heart.   Objective: Vitals:   03/29/16 1522 03/29/16 2001 03/30/16 0500 03/30/16 1415  BP: (!) 160/80 (!) 143/78 (!) 150/64 (!) 159/78  Pulse: 70 85 81 76  Resp: 16 16 18 18   Temp: 98.3 F (36.8 C) 99.4 F (37.4 C) 98.3 F (36.8 C) 98.1 F (36.7 C)  TempSrc: Oral Oral Oral Oral  SpO2: 96% 99% 96% 95%  Weight:      Height:        Intake/Output Summary (Last 24 hours) at 03/30/16 1525 Last data filed at 03/30/16 1200  Gross per 24 hour  Intake              480 ml  Output  250 ml  Net              230 ml   Filed Weights   03/28/16 1846 03/29/16 0059  Weight: 71.7 kg (158 lb) 75 kg (165 lb 5.5 oz)    Examination:  General exam: Appears calm and comfortable  Respiratory system: Clear to auscultation. Respiratory effort normal. Cardiovascular system: S1 & S2 heard, RRR. No JVD, murmurs, rubs, gallops or clicks. No pedal edema. Gastrointestinal system: Abdomen is nondistended, soft and nontender. No organomegaly or masses felt. Normal bowel sounds heard. Central nervous system: Alert and oriented. No focal neurological deficits. Extremities: left lower extremity swollen with some erythema and tenderness Skin: No rashes, lesions or ulcers, erythema of left lower  extremity Psychiatry: Mood & affect appropriate. Limited insight    Data Reviewed: I have personally reviewed following labs and imaging studies  CBC:  Recent Labs Lab 03/28/16 2049 03/29/16 0544  WBC 16.3* 14.1*  NEUTROABS 12.9*  --   HGB 13.6 13.2  HCT 41.2 40.3  MCV 94.7 94.6  PLT 192 0000000   Basic Metabolic Panel:  Recent Labs Lab 03/28/16 2049 03/29/16 0544  NA 138 141  K 4.5 4.2  CL 104 106  CO2 26 30  GLUCOSE 134* 124*  BUN 23* 20  CREATININE 1.09* 1.08*  CALCIUM 8.4* 8.4*  MG  --  2.2  PHOS  --  2.7   GFR: Estimated Creatinine Clearance: 32.3 mL/min (by C-G formula based on SCr of 1.08 mg/dL (H)). Liver Function Tests:  Recent Labs Lab 03/28/16 2049 03/29/16 0544  AST 23 19  ALT 19 19  ALKPHOS 96 80  BILITOT 0.5 0.7  PROT 6.5 6.3*  ALBUMIN 3.4* 3.1*   No results for input(s): LIPASE, AMYLASE in the last 168 hours. No results for input(s): AMMONIA in the last 168 hours. Coagulation Profile:  Recent Labs Lab 03/30/16 0532  INR 1.15   Cardiac Enzymes:  Recent Labs Lab 03/29/16 0017 03/29/16 0544 03/29/16 1001  TROPONINI <0.03 <0.03 <0.03   BNP (last 3 results) No results for input(s): PROBNP in the last 8760 hours. HbA1C:  Recent Labs  03/29/16 0544  HGBA1C 5.4   CBG:  Recent Labs Lab 03/30/16 0721  GLUCAP 93   Lipid Profile: No results for input(s): CHOL, HDL, LDLCALC, TRIG, CHOLHDL, LDLDIRECT in the last 72 hours. Thyroid Function Tests:  Recent Labs  03/29/16 0544  TSH 1.430   Anemia Panel: No results for input(s): VITAMINB12, FOLATE, FERRITIN, TIBC, IRON, RETICCTPCT in the last 72 hours. Sepsis Labs:  Recent Labs Lab 03/28/16 2057  LATICACIDVEN 2.6*    Recent Results (from the past 240 hour(s))  Culture, blood (routine x 2)     Status: None (Preliminary result)   Collection Time: 03/28/16 11:34 PM  Result Value Ref Range Status   Specimen Description BLOOD L ARM  Final   Special Requests BOTTLES DRAWN  AEROBIC AND ANAEROBIC 5ML EA  Final   Culture   Final    NO GROWTH 1 DAY Performed at [facility]    Report Status PENDING  Incomplete  Culture, blood (routine x 2)     Status: None (Preliminary result)   Collection Time: 03/28/16 11:35 PM  Result Value Ref Range Status   Specimen Description BLOOD RIGHT HAND  Final   Special Requests BOTTLES DRAWN AEROBIC AND ANAEROBIC 5ML EA  Final   Culture   Final    NO GROWTH 1 DAY Performed at [facility]  Report Status PENDING  Incomplete  MRSA PCR Screening     Status: None   Collection Time: 03/29/16  1:24 AM  Result Value Ref Range Status   MRSA by PCR NEGATIVE NEGATIVE Final    Comment:        The GeneXpert MRSA Assay (FDA approved for NASAL specimens only), is one component of a comprehensive MRSA colonization surveillance program. It is not intended to diagnose MRSA infection nor to guide or monitor treatment for MRSA infections.          Radiology Studies: Dg Tibia/fibula Left  Result Date: 03/29/2016 CLINICAL DATA:  Left lower extremity edema EXAM: LEFT TIBIA AND FIBULA - 2 VIEW COMPARISON:  None. FINDINGS: There is no evidence of fracture or other focal bone lesions. There are vascular calcifications. Mild-to-moderate soft tissue edema. IMPRESSION: Mild-to-moderate soft tissue edema without osseous abnormality. Electronically Signed   By: Ulyses Jarred M.D.   On: 03/29/2016 01:04   Ct Angio Chest Pe W Or Wo Contrast  Result Date: 03/29/2016 CLINICAL DATA:  LEFT leg edema, elevated D-dimer, suspected DVT, history dementia, MI, perianal squamous cell carcinoma, former smoker EXAM: CT ANGIOGRAPHY CHEST WITH CONTRAST TECHNIQUE: Multidetector CT imaging of the chest was performed using the standard protocol during bolus administration of intravenous contrast. Multiplanar CT image reconstructions and MIPs were obtained to evaluate the vascular anatomy. Beam hardening artifacts from necklace. CONTRAST:  100 cc  Isovue 370 IV COMPARISON:  None FINDINGS: Cardiovascular: Atherosclerotic calcifications aorta and proximal great vessels. Aorta normal caliber without aneurysm or dissection. Pulmonary arteries adequately opacified. Filling defects identified at the bifurcations of the LEFT and RIGHT pulmonary arteries consistent with saddle pulmonary emboli. Extension of emboli into the LEFT lower lobe and RIGHT upper lobe. RV/LV ratio = 1.14, elevated, consistent with RIGHT heart strain and at least submassive P.E. No pericardial effusion. Mediastinum/Nodes: Small hiatal hernia. Esophagus unremarkable. Base of cervical region partially obscured by artifacts from necklace. No definite thoracic adenopathy. Lungs/Pleura: Lungs clear, scattered respiratory motion artifacts present. Upper Abdomen: Stomach unopacified and under distended, unable to exclude gastric wall thickening. Remaining visualized upper abdomen normal appearance. Musculoskeletal: Osseous demineralization. Review of the MIP images confirms the above findings. IMPRESSION: Positive exam for pulmonary embolism with saddle emboli at the bifurcations of the LEFT and RIGHT pulmonary arteries, with thrombus extending into RIGHT upper lobe and LEFT lower lobe. Positive for acute PE with CT evidence of right heart strain (RV/LV Ratio = 1.14) consistent with at least submassive (intermediate risk) PE. The presence of right heart strain has been associated with an increased risk of morbidity and mortality. Please activate Code PE by paging [phone]. Critical Value/emergent results were called by telephone at the time of interpretation on 03/29/2016 at 1:52 pm to Dr. Ilene Qua , who verbally acknowledged these results. Electronically Signed   By: Lavonia Dana M.D.   On: 03/29/2016 13:53        Scheduled Meds: . citalopram  20 mg Oral q morning - 10a  . donepezil  10 mg Oral q morning - 10a  . enoxaparin (LOVENOX) injection  70 mg Subcutaneous Q12H  .  memantine  28 mg Oral Daily  . sodium chloride flush  3 mL Intravenous Q12H  . warfarin  4 mg Oral ONCE-1800  . warfarin   Does not apply Once  . Warfarin - Pharmacist Dosing Inpatient   Does not apply q1800   Continuous Infusions:   LOS: 2 days    Time spent: 30 minutes  Loretha Stapler, MD [facility] Pager [phone]  If 7PM-7AM, please contact night-coverage [email or web address] Password [facility] 03/30/2016, 3:25 PM

## 2016-03-31 DIAGNOSIS — F0280 Dementia in other diseases classified elsewhere without behavioral disturbance: Secondary | ICD-10-CM

## 2016-03-31 DIAGNOSIS — G3183 Dementia with Lewy bodies: Secondary | ICD-10-CM

## 2016-03-31 LAB — PROTIME-INR
INR: 1.16
PROTHROMBIN TIME: 14.9 s (ref 11.4–15.2)

## 2016-03-31 LAB — URINALYSIS, ROUTINE W REFLEX MICROSCOPIC
Bilirubin Urine: NEGATIVE
GLUCOSE, UA: NEGATIVE mg/dL
Ketones, ur: NEGATIVE mg/dL
NITRITE: NEGATIVE
Protein, ur: 30 mg/dL — AB
Specific Gravity, Urine: 1.032 — ABNORMAL HIGH (ref 1.005–1.030)
pH: 5 (ref 5.0–8.0)

## 2016-03-31 MED ORDER — POLYETHYLENE GLYCOL 3350 17 G PO PACK
17.0000 g | PACK | Freq: Every day | ORAL | Status: DC
  Administered 2016-03-31 – 2016-04-05 (×6): 17 g via ORAL
  Filled 2016-03-31 (×6): qty 1

## 2016-03-31 MED ORDER — SENNA 8.6 MG PO TABS
2.0000 | ORAL_TABLET | Freq: Every day | ORAL | Status: DC
  Administered 2016-03-31 – 2016-04-04 (×5): 17.2 mg via ORAL
  Filled 2016-03-31 (×5): qty 2

## 2016-03-31 MED ORDER — WARFARIN SODIUM 5 MG PO TABS
5.0000 mg | ORAL_TABLET | Freq: Once | ORAL | Status: AC
  Administered 2016-03-31: 5 mg via ORAL
  Filled 2016-03-31: qty 1

## 2016-03-31 NOTE — Progress Notes (Signed)
ANTICOAGULATION CONSULT NOTE  Pharmacy Consult for Warfarin, Lovenox Indication: new pulmonary embolus and DVT  No Known Allergies  Patient Measurements: Height: 5\' 5"  (165.1 cm) Weight: 165 lb 5.5 oz (75 kg) IBW/kg (Calculated) : 57  Vital Signs: Temp: 98.4 F (36.9 C) (01/16 0405) Temp Source: Axillary (01/16 0405) BP: 161/77 (01/16 0405) Pulse Rate: 79 (01/16 0405)  Labs:  Recent Labs  03/28/16 2049 03/29/16 0017 03/29/16 0544 03/29/16 1001 03/30/16 0532 03/31/16 0519  HGB 13.6  --  13.2  --   --   --   HCT 41.2  --  40.3  --   --   --   PLT 192  --  193  --   --   --   LABPROT  --   --   --   --  14.8 14.9  INR  --   --   --   --  1.15 1.16  CREATININE 1.09*  --  1.08*  --   --   --   TROPONINI  --  <0.03 <0.03 <0.03  --   --     Estimated Creatinine Clearance: 32.3 mL/min (by C-G formula based on SCr of 1.08 mg/dL (H)).   Medications:  Scheduled:  . citalopram  20 mg Oral q morning - 10a  . donepezil  10 mg Oral q morning - 10a  . enoxaparin (LOVENOX) injection  70 mg Subcutaneous Q12H  . memantine  28 mg Oral Daily  . sodium chloride flush  3 mL Intravenous Q12H  . warfarin   Does not apply Once  . Warfarin - Pharmacist Dosing Inpatient   Does not apply q1800    Assessment: 12 yoF admitted on 1/13 with hx of dementia and peri-anal skin cancer now with severe LLE swelling.  She's currently on lovenox and warfarin for new DVT/PE.  - 1/14 Vascular US: positive for LLE DVT - 1/14 CTa: positive for saddle emboli at the bifurcations of the LEFT and RIGHT pulmonary arteries, with thrombus extending into RIGHT upper lobe and LEFT lower lobe.  Not a tPA candidate per PCCM.   Today, 03/31/2016:  Anticoagulation overlap day #3 of 5 days minimum  INR: 1.16  CBC: Hgb and Plt stable/WNL  SCr 1.08 with CrCl ~ 32 ml/min  Diet: heart healthy  No major drug-drug interactions noted.   Goal of Therapy:  INR 2-3 Anti-Xa level 0.6-1 units/ml 4hrs after LMWH  dose given Monitor platelets by anticoagulation protocol: Yes   Plan:   Continue Lovenox 70 mg SQ q12h.  Warfarin 5 mg PO x 1 at 1800  Monitor for signs and symptoms of bleeding.  Educated patient's daughter on warfarin on 03/30/16  Dia Sitter, PharmD, BCPS 03/31/2016 7:19 AM

## 2016-03-31 NOTE — Progress Notes (Signed)
PROGRESS NOTE    Laurie Woods  [id number] DOB: [birth year] DOA: 03/28/2016 PCP: Geoffery Lyons, MD    Brief Narrative:  Laurie Woods is a 83 y.o. female with medical history significant of dementia  Peri-anal skin cancer. Presented with 2 day history of severe left lower extremity swelling patient has significant dementia may unable to provide further history. Patient has so denies any shortness of breath or chest pain. Denies any pain lower extremity did not endorse any history of fevers or chills she is unsure what's making the swelling worse or better.   Regarding pertinent Chronic problems: As per review of records Family states that while she was living in [place] patient had an EKG done which showed old MI but they were not aware of any history of coronary artery disease   IN ER:  Temp (24hrs), Avg:98.9 F (37.2 C), Min:98.9 F (37.2 C), Max:98.9 F (37.2 C) RR 18 satting 96% HR 83 BP 147/66 lactic acid 2.16 WBC 16.3 Creatinine 1.09 which is baseline .   Patient underwent lower extremity doppler and was found to have extensive DVT of left leg extending to left iliac vein.  CTA showing bilateral saddle emboli and signs of right heart strain.  Echocardiogram ordered and performed and did not show signs of right heart strain.  Patient started on coumadin with lovenox bridge.  Family updated.    Assessment & Plan:   Active Problems:   Left leg cellulitis   Leg edema   . Left leg cellulitis - leg warm and red - vascular U/S positive for extensive DVT up to iliac vein - discussed with vascular surgery- no intervention at this time  - already on treatment dose lovenox; bridging to coumadin per pharmacy - monitor INR's daily - CTA showing bilateral saddle emboli and signs of right heart strain - echocardiogram not showing right heart strain - telemetry discontinued 2/2 agitation and family preference - will bridge to coumadin with lovenox and will stay inpatient  until therapeutic INR  . Leg edema - likely due to DVT - patient on treatment dose lovenox - continue coumadin - improved  Dementia - expect some degree of sundowning while hospitalized - redirection - continue aricept and namenda - will try to keep room dark when nighttime and avoid nighttime awakenings  DVT prophylaxis:    Lovenox and coumadin Code Status:  DNR/DNI as per paperwork Family Communication:  no family at bedside today Disposition Plan:   Back to current facility when stable - likely when INR therapeutic   Consultants:   None  Procedures:   None  Antimicrobials:   Ancef  Rocephin    Subjective: Patient laying in bed.  She reports she feels well and denies pain.  Poor short term and long term memory.  Objective: Vitals:   03/30/16 1415 03/30/16 2022 03/31/16 0405 03/31/16 1248  BP: (!) 159/78 (!) 144/89 (!) 161/77 (!) 143/70  Pulse: 76 90 79 90  Resp: 18 18 18 16   Temp: 98.1 F (36.7 C) 98 F (36.7 C) 98.4 F (36.9 C) 98.1 F (36.7 C)  TempSrc: Oral Oral Axillary Oral  SpO2: 95% 96% 94% 96%  Weight:      Height:        Intake/Output Summary (Last 24 hours) at 03/31/16 1455 Last data filed at 03/31/16 1300  Gross per 24 hour  Intake              600 ml  Output  600 ml  Net                0 ml   Filed Weights   03/28/16 1846 03/29/16 0059  Weight: 71.7 kg (158 lb) 75 kg (165 lb 5.5 oz)    Examination:  General exam: Appears calm and comfortable  Respiratory system: Clear to auscultation. Respiratory effort normal. Cardiovascular system: S1 & S2 heard, RRR. No JVD, murmurs, rubs, gallops or clicks. No pedal edema. Gastrointestinal system: Abdomen is nondistended, soft and nontender. No organomegaly or masses felt. Normal bowel sounds heard. Central nervous system: Alert and oriented to self. No focal neurological deficits. Extremities: left lower extremity swollen with some erythema and tenderness but improved from  prior exam. Skin: No rashes, lesions or ulcers, erythema of left lower extremity improved from previous exam- mostly erythema of the proximal ankle distally Psychiatry: Mood & affect appropriate. Limited insight    Data Reviewed: I have personally reviewed following labs and imaging studies  CBC:  Recent Labs Lab 03/28/16 2049 03/29/16 0544  WBC 16.3* 14.1*  NEUTROABS 12.9*  --   HGB 13.6 13.2  HCT 41.2 40.3  MCV 94.7 94.6  PLT 192 0000000   Basic Metabolic Panel:  Recent Labs Lab 03/28/16 2049 03/29/16 0544  NA 138 141  K 4.5 4.2  CL 104 106  CO2 26 30  GLUCOSE 134* 124*  BUN 23* 20  CREATININE 1.09* 1.08*  CALCIUM 8.4* 8.4*  MG  --  2.2  PHOS  --  2.7   GFR: Estimated Creatinine Clearance: 32.3 mL/min (by C-G formula based on SCr of 1.08 mg/dL (H)). Liver Function Tests:  Recent Labs Lab 03/28/16 2049 03/29/16 0544  AST 23 19  ALT 19 19  ALKPHOS 96 80  BILITOT 0.5 0.7  PROT 6.5 6.3*  ALBUMIN 3.4* 3.1*   No results for input(s): LIPASE, AMYLASE in the last 168 hours. No results for input(s): AMMONIA in the last 168 hours. Coagulation Profile:  Recent Labs Lab 03/30/16 0532 03/31/16 0519  INR 1.15 1.16   Cardiac Enzymes:  Recent Labs Lab 03/29/16 0017 03/29/16 0544 03/29/16 1001  TROPONINI <0.03 <0.03 <0.03   BNP (last 3 results) No results for input(s): PROBNP in the last 8760 hours. HbA1C:  Recent Labs  03/29/16 0544  HGBA1C 5.4   CBG:  Recent Labs Lab 03/30/16 0721  GLUCAP 93   Lipid Profile: No results for input(s): CHOL, HDL, LDLCALC, TRIG, CHOLHDL, LDLDIRECT in the last 72 hours. Thyroid Function Tests:  Recent Labs  03/29/16 0544  TSH 1.430   Anemia Panel: No results for input(s): VITAMINB12, FOLATE, FERRITIN, TIBC, IRON, RETICCTPCT in the last 72 hours. Sepsis Labs:  Recent Labs Lab 03/28/16 2057  LATICACIDVEN 2.6*    Recent Results (from the past 240 hour(s))  Culture, blood (routine x 2)     Status: None  (Preliminary result)   Collection Time: 03/28/16 11:34 PM  Result Value Ref Range Status   Specimen Description BLOOD L ARM  Final   Special Requests BOTTLES DRAWN AEROBIC AND ANAEROBIC 5ML EA  Final   Culture   Final    NO GROWTH 1 DAY Performed at [facility]    Report Status PENDING  Incomplete  Culture, blood (routine x 2)     Status: None (Preliminary result)   Collection Time: 03/28/16 11:35 PM  Result Value Ref Range Status   Specimen Description BLOOD RIGHT HAND  Final   Special Requests BOTTLES DRAWN AEROBIC AND ANAEROBIC  5ML EA  Final   Culture   Final    NO GROWTH 1 DAY Performed at [facility]    Report Status PENDING  Incomplete  MRSA PCR Screening     Status: None   Collection Time: 03/29/16  1:24 AM  Result Value Ref Range Status   MRSA by PCR NEGATIVE NEGATIVE Final    Comment:        The GeneXpert MRSA Assay (FDA approved for NASAL specimens only), is one component of a comprehensive MRSA colonization surveillance program. It is not intended to diagnose MRSA infection nor to guide or monitor treatment for MRSA infections.          Radiology Studies: No results found.      Scheduled Meds: . citalopram  20 mg Oral q morning - 10a  . donepezil  10 mg Oral q morning - 10a  . enoxaparin (LOVENOX) injection  70 mg Subcutaneous Q12H  . memantine  28 mg Oral Daily  . polyethylene glycol  17 g Oral Daily  . senna  2 tablet Oral QHS  . sodium chloride flush  3 mL Intravenous Q12H  . warfarin  5 mg Oral ONCE-1800  . warfarin   Does not apply Once  . Warfarin - Pharmacist Dosing Inpatient   Does not apply q1800   Continuous Infusions:   LOS: 3 days    Time spent: 30 minutes    Loretha Stapler, MD [facility] Pager [phone]  If 7PM-7AM, please contact night-coverage [email or web address] Password [email or web address] 03/31/2016, 2:55 PM

## 2016-04-01 DIAGNOSIS — F0391 Unspecified dementia with behavioral disturbance: Secondary | ICD-10-CM

## 2016-04-01 DIAGNOSIS — F03918 Unspecified dementia, unspecified severity, with other behavioral disturbance: Secondary | ICD-10-CM

## 2016-04-01 DIAGNOSIS — IMO0002 Reserved for concepts with insufficient information to code with codable children: Secondary | ICD-10-CM

## 2016-04-01 DIAGNOSIS — N390 Urinary tract infection, site not specified: Secondary | ICD-10-CM

## 2016-04-01 DIAGNOSIS — R3989 Other symptoms and signs involving the genitourinary system: Secondary | ICD-10-CM

## 2016-04-01 DIAGNOSIS — I749 Embolism and thrombosis of unspecified artery: Secondary | ICD-10-CM

## 2016-04-01 LAB — PROTIME-INR
INR: 1.27
PROTHROMBIN TIME: 16 s — AB (ref 11.4–15.2)

## 2016-04-01 LAB — CBC
HCT: 38.3 % (ref 36.0–46.0)
HEMOGLOBIN: 12.7 g/dL (ref 12.0–15.0)
MCH: 31.4 pg (ref 26.0–34.0)
MCHC: 33.2 g/dL (ref 30.0–36.0)
MCV: 94.8 fL (ref 78.0–100.0)
Platelets: 303 10*3/uL (ref 150–400)
RBC: 4.04 MIL/uL (ref 3.87–5.11)
RDW: 12.9 % (ref 11.5–15.5)
WBC: 9.7 10*3/uL (ref 4.0–10.5)

## 2016-04-01 MED ORDER — WARFARIN SODIUM 5 MG PO TABS
5.0000 mg | ORAL_TABLET | Freq: Once | ORAL | Status: AC
  Administered 2016-04-01: 5 mg via ORAL
  Filled 2016-04-01: qty 1

## 2016-04-01 MED ORDER — DEXTROSE 5 % IV SOLN
1.0000 g | INTRAVENOUS | Status: AC
  Administered 2016-04-01 – 2016-04-03 (×3): 1 g via INTRAVENOUS
  Filled 2016-04-01 (×3): qty 10

## 2016-04-01 NOTE — Progress Notes (Signed)
PROGRESS NOTE    Laurie Woods  [id number] DOB: 1919/07/23 DOA: 03/28/2016 PCP: Geoffery Lyons, MD  Brief Narrative:  Laurie Dane McCallumis a 83 y.o.femalewith medical history significant of dementia  Peri-anal skin cancer. Presented with 2 day history ofsevereleft lower extremity swelling patient has significant dementia may unable to provide further history. Patient  denied any shortness of breath or chest pain.Denies any pain lower extremity did not endorse any history of fevers or chills she is unsure what's making the swelling worse or better. Patient underwent lower extremity doppler and was found to have extensive DVT of left leg extending to left iliac vein.  CTA showing bilateral saddle emboli and signs of right heart strain.  Echocardiogram ordered and performed and did not show signs of right heart strain.  Patient started on coumadin with lovenox bridge. Will remain inpatient until Coumadin is therapeutic on Lovenox Bridge.   Assessment & Plan:   Principal Problem:   Saddle embolus (HCC) Active Problems:   Left leg cellulitis   Leg edema   Suspected UTI   Dementia with behavioral disturbance  Bilateral Saddle Submassive Pulmonary Emboli -CT Angio Chest PE showed Positive exam for pulmonary embolism with saddle emboli at the bifurcations of the LEFT and RIGHT pulmonary arteries, with thrombus extending into RIGHT upper lobe and LEFT lower lobe. -CT Evidence of Heart Strain however ECHO showed Normal LV size with moderate LV hypertrophy. EF 65-70%. Normal RV size and systolic function. No significant valvular abnormalities. -Telemetry was D/C'd 2/2 to Agitation and Family Preference -INR Subtherapeutic at 1.27 -Will bridge to Coumadin with Full Dose Lovenox and will stay inpatient until therapeutic INR -Monitor INRs and CBC's Daily   Left Leg Edema with ?Cellulitis -Leg warm and red improving, Suspected from DVT -Vascular U/S positive for extensive DVT up to  iliac vein -Discussed with vascular surgery- no intervention at this time  -Already on treatment dose lovenox; bridging to coumadin per pharmacy -Monitor INR's daily  UTI -Urinalysis showed Many Bacteria, Large Leukocytes, TNTC WBC and Negative Nitrite -Unable to determine if symptomatic because of Dementia -Urine Culture Ordered -Empiric Abx Treatment With IV Ceftriaxone for total of 3 Days  Dementia -Expect some degree of sundowning while hospitalized -C/w redirection -Continue Donepezil 10 mg po daily and Memantine XR 28 mg po Daily  -Will try to keep room dark when nighttime and avoid nighttime awakenings  DVT prophylaxis: Coumadin with Lovenox Bridge Code Status: DNR/DNI Family Communication: No family present at bedside Disposition Plan: Back to SNF when INR is stable  Consultants:   None   Procedures: None   Antimicrobials: IV Ancef and IV Ceftriaxone given on Admission; IV Ceftriaxone started 1/17 for suspected UTI -->  Subjective: Seen and examined at bedside and was pleasantly confused from her dementia. Had no active concerns or complaints and feels well. No SOB. Has some left leg pain.   Objective: Vitals:   03/31/16 0405 03/31/16 1248 03/31/16 2035 04/01/16 0435  BP: (!) 161/77 (!) 143/70 112/78 118/68  Pulse: 79 90 (!) 127 81  Resp: 18 16 18 16   Temp: 98.4 F (36.9 C) 98.1 F (36.7 C) 99 F (37.2 C) 99.2 F (37.3 C)  TempSrc: Axillary Oral Oral Oral  SpO2: 94% 96% 94% 93%  Weight:      Height:        Intake/Output Summary (Last 24 hours) at 04/01/16 1209 Last data filed at 04/01/16 0933  Gross per 24 hour  Intake  240 ml  Output              800 ml  Net             -560 ml   Filed Weights   03/28/16 1846 03/29/16 0059  Weight: 71.7 kg (158 lb) 75 kg (165 lb 5.5 oz)   Examination: Physical Exam:  Constitutional: WN/WD, NAD and appears calm and comfortable; Pleasantly demented Eyes: Lids and conjunctivae normal, sclerae  anicteric  ENMT: External Ears, Nose appear normal. Grossly normal hearing.  Neck: Appears normal, supple, no cervical masses, normal ROM, no appreciable thyromegaly, no JVD Respiratory: Clear to auscultation bilaterally, no wheezing, rales, rhonchi or crackles. Normal respiratory effort and patient is not tachypenic. No accessory muscle use.  Cardiovascular: RRR, no murmurs / rubs / gallops. S1 and S2 auscultated. 1+ Left lower extremity edema with some erythema.  Abdomen: Soft, non-tender, non-distended. No masses palpated. No appreciable hepatosplenomegaly. Bowel sounds positive x4.  GU: Deferred. Musculoskeletal: No clubbing / cyanosis of digits/nails. No joint deformity upper and lower extremities.  Skin:  Warm and dry.  Neurologic: CN 2-12 grossly intact with no focal deficits. Sensation intact in all 4 Extremities,  Psychiatric: Impaired judgment and insight. Alert but not oriented. Normal mood and appropriate affect.   Data Reviewed: I have personally reviewed following labs and imaging studies  CBC:  Recent Labs Lab 03/28/16 2049 03/29/16 0544 04/01/16 0515  WBC 16.3* 14.1* 9.7  NEUTROABS 12.9*  --   --   HGB 13.6 13.2 12.7  HCT 41.2 40.3 38.3  MCV 94.7 94.6 94.8  PLT 192 193 [id number]   Basic Metabolic Panel:  Recent Labs Lab 03/28/16 2049 03/29/16 0544  NA 138 141  K 4.5 4.2  CL 104 106  CO2 26 30  GLUCOSE 134* 124*  BUN 23* 20  CREATININE 1.09* 1.08*  CALCIUM 8.4* 8.4*  MG  --  2.2  PHOS  --  2.7   GFR: Estimated Creatinine Clearance: 32.3 mL/min (by C-G formula based on SCr of 1.08 mg/dL (H)). Liver Function Tests:  Recent Labs Lab 03/28/16 2049 03/29/16 0544  AST 23 19  ALT 19 19  ALKPHOS 96 80  BILITOT 0.5 0.7  PROT 6.5 6.3*  ALBUMIN 3.4* 3.1*   No results for input(s): LIPASE, AMYLASE in the last 168 hours. No results for input(s): AMMONIA in the last 168 hours. Coagulation Profile:  Recent Labs Lab 03/30/16 0532 03/31/16 0519  04/01/16 0515  INR 1.15 1.16 1.27   Cardiac Enzymes:  Recent Labs Lab 03/29/16 0017 03/29/16 0544 03/29/16 1001  TROPONINI <0.03 <0.03 <0.03   BNP (last 3 results) No results for input(s): PROBNP in the last 8760 hours. HbA1C: No results for input(s): HGBA1C in the last 72 hours. CBG:  Recent Labs Lab 03/30/16 0721  GLUCAP 93   Lipid Profile: No results for input(s): CHOL, HDL, LDLCALC, TRIG, CHOLHDL, LDLDIRECT in the last 72 hours. Thyroid Function Tests: No results for input(s): TSH, T4TOTAL, FREET4, T3FREE, THYROIDAB in the last 72 hours. Anemia Panel: No results for input(s): VITAMINB12, FOLATE, FERRITIN, TIBC, IRON, RETICCTPCT in the last 72 hours. Sepsis Labs:  Recent Labs Lab 03/28/16 2057  LATICACIDVEN 2.6*    Recent Results (from the past 240 hour(s))  Culture, blood (routine x 2)     Status: None (Preliminary result)   Collection Time: 03/28/16 11:34 PM  Result Value Ref Range Status   Specimen Description BLOOD L ARM  Final   Special Requests BOTTLES  DRAWN AEROBIC AND ANAEROBIC 5ML EA  Final   Culture   Final    NO GROWTH 2 DAYS Performed at [facility], [place] [address], [place], [place] [address]    Report Status PENDING  Incomplete  Culture, blood (routine x 2)     Status: None (Preliminary result)   Collection Time: 03/28/16 11:35 PM  Result Value Ref Range Status   Specimen Description BLOOD RIGHT HAND  Final   Special Requests BOTTLES DRAWN AEROBIC AND ANAEROBIC 5ML EA  Final   Culture   Final    NO GROWTH 2 DAYS Performed at [facility], [place] [address] [place], [place] [address]    Report Status PENDING  Incomplete  MRSA PCR Screening     Status: None   Collection Time: 03/29/16  1:24 AM  Result Value Ref Range Status   MRSA by PCR NEGATIVE NEGATIVE Final    Comment:        The GeneXpert MRSA Assay (FDA approved for NASAL specimens only), is one component of a comprehensive MRSA colonization surveillance program. It is  not intended to diagnose MRSA infection nor to guide or monitor treatment for MRSA infections.     Radiology Studies: No results found.   Scheduled Meds: . cefTRIAXone (ROCEPHIN)  IV  1 g Intravenous Q24H  . citalopram  20 mg Oral q morning - 10a  . donepezil  10 mg Oral q morning - 10a  . enoxaparin (LOVENOX) injection  70 mg Subcutaneous Q12H  . memantine  28 mg Oral Daily  . polyethylene glycol  17 g Oral Daily  . senna  2 tablet Oral QHS  . sodium chloride flush  3 mL Intravenous Q12H  . warfarin  5 mg Oral ONCE-1800  . warfarin   Does not apply Once  . Warfarin - Pharmacist Dosing Inpatient   Does not apply q1800   Continuous Infusions:   LOS: 4 days   Kerney Elbe, DO [facility] Pager [phone]  If 7PM-7AM, please contact night-coverage [email or web address] Password [facility] 04/01/2016, 12:09 PM

## 2016-04-01 NOTE — Progress Notes (Signed)
[place] for Lovenox-->Warfarin Indication: new pulmonary embolus and DVT  No Known Allergies  Patient Measurements: Height: 5\' 5"  (165.1 cm) Weight: 165 lb 5.5 oz (75 kg) IBW/kg (Calculated) : 57  Vital Signs: Temp: 99.2 F (37.3 C) (01/17 0435) Temp Source: Oral (01/17 0435) BP: 118/68 (01/17 0435) Pulse Rate: 81 (01/17 0435)  Labs:  Recent Labs  03/29/16 1001 03/30/16 0532 03/31/16 0519 04/01/16 0515  HGB  --   --   --  12.7  HCT  --   --   --  38.3  PLT  --   --   --  303  LABPROT  --  14.8 14.9 16.0*  INR  --  1.15 1.16 1.27  TROPONINI <0.03  --   --   --     Estimated Creatinine Clearance: 32.3 mL/min (by C-G formula based on SCr of 1.08 mg/dL (H)).   Medications:  Scheduled:  . citalopram  20 mg Oral q morning - 10a  . donepezil  10 mg Oral q morning - 10a  . enoxaparin (LOVENOX) injection  70 mg Subcutaneous Q12H  . memantine  28 mg Oral Daily  . polyethylene glycol  17 g Oral Daily  . senna  2 tablet Oral QHS  . sodium chloride flush  3 mL Intravenous Q12H  . warfarin   Does not apply Once  . Warfarin - Pharmacist Dosing Inpatient   Does not apply q1800    Assessment: 30 yoF admitted on 1/13 with hx of dementia and peri-anal skin cancer now with severe LLE swelling.  She's currently on lovenox and warfarin for new DVT/PE.  - 1/14 Vascular US: positive for LLE DVT - 1/14 CTa: positive for saddle emboli at the bifurcations of the LEFT and RIGHT pulmonary arteries, with thrombus extending into RIGHT upper lobe and LEFT lower lobe.  Not a tPA candidate per PCCM.   Today, 04/01/2016:  Anticoagulation overlap day #4 of 5 days minimum  INR: 1.27  CBC: Hgb and Plt stable/WNL  SCr 1.08 with CrCl ~ 32 ml/min  Diet: heart healthy, eating 10-50% of meal tray  No major drug-drug interactions noted.  Goal of Therapy:  INR 2-3 Anti-Xa level 0.6-1 units/ml 4hrs after LMWH dose given Monitor platelets by  anticoagulation protocol: Yes   Plan:   Continue Lovenox 70 mg SQ q12h.  Warfarin 5 mg PO x 1 at 1800  Monitor for signs and symptoms of bleeding.  Educated patient's daughter on warfarin on 03/30/16  Netta Cedars, PharmD, BCPS Pager: [phone] 04/01/2016 7:43 AM

## 2016-04-02 LAB — PROTIME-INR
INR: 1.46
Prothrombin Time: 17.9 seconds — ABNORMAL HIGH (ref 11.4–15.2)

## 2016-04-02 LAB — CBC WITH DIFFERENTIAL/PLATELET
BASOS ABS: 0 10*3/uL (ref 0.0–0.1)
Basophils Relative: 0 %
Eosinophils Absolute: 0.3 10*3/uL (ref 0.0–0.7)
Eosinophils Relative: 3 %
HEMATOCRIT: 39.5 % (ref 36.0–46.0)
HEMOGLOBIN: 13 g/dL (ref 12.0–15.0)
LYMPHS ABS: 1.8 10*3/uL (ref 0.7–4.0)
LYMPHS PCT: 19 %
MCH: 31.4 pg (ref 26.0–34.0)
MCHC: 32.9 g/dL (ref 30.0–36.0)
MCV: 95.4 fL (ref 78.0–100.0)
Monocytes Absolute: 1.3 10*3/uL — ABNORMAL HIGH (ref 0.1–1.0)
Monocytes Relative: 14 %
NEUTROS ABS: 5.9 10*3/uL (ref 1.7–7.7)
Neutrophils Relative %: 64 %
Platelets: 304 10*3/uL (ref 150–400)
RBC: 4.14 MIL/uL (ref 3.87–5.11)
RDW: 12.9 % (ref 11.5–15.5)
WBC: 9.2 10*3/uL (ref 4.0–10.5)

## 2016-04-02 LAB — COMPREHENSIVE METABOLIC PANEL
ALBUMIN: 2.7 g/dL — AB (ref 3.5–5.0)
ALK PHOS: 85 U/L (ref 38–126)
ALT: 20 U/L (ref 14–54)
ANION GAP: 7 (ref 5–15)
AST: 23 U/L (ref 15–41)
BUN: 20 mg/dL (ref 6–20)
CHLORIDE: 105 mmol/L (ref 101–111)
CO2: 27 mmol/L (ref 22–32)
CREATININE: 0.91 mg/dL (ref 0.44–1.00)
Calcium: 8.2 mg/dL — ABNORMAL LOW (ref 8.9–10.3)
GFR calc non Af Amer: 52 mL/min — ABNORMAL LOW (ref >60)
GLUCOSE: 96 mg/dL (ref 65–99)
Potassium: 3.9 mmol/L (ref 3.5–5.1)
SODIUM: 139 mmol/L (ref 135–145)
Total Bilirubin: 0.5 mg/dL (ref 0.3–1.2)
Total Protein: 5.9 g/dL — ABNORMAL LOW (ref 6.5–8.1)

## 2016-04-02 LAB — URINE CULTURE

## 2016-04-02 LAB — PHOSPHORUS: PHOSPHORUS: 3.4 mg/dL (ref 2.5–4.6)

## 2016-04-02 LAB — MAGNESIUM: Magnesium: 2.4 mg/dL (ref 1.7–2.4)

## 2016-04-02 MED ORDER — WARFARIN SODIUM 2.5 MG PO TABS
7.5000 mg | ORAL_TABLET | Freq: Once | ORAL | Status: AC
  Administered 2016-04-02: 17:00:00 7.5 mg via ORAL
  Filled 2016-04-02: qty 1

## 2016-04-02 MED ORDER — WARFARIN SODIUM 6 MG PO TABS: 6.0000 mg | ORAL_TABLET | Freq: Once | ORAL | Status: DC

## 2016-04-02 NOTE — [place] FL2 (Signed)
  [place] MEDICAID FL2 LEVEL OF CARE SCREENING TOOL     IDENTIFICATION  Patient Name: Laurie Woods Birthdate: [birth year] Sex: female Admission Date (Current Location): 03/28/2016  [facility] and [place] Number:  Herbalist and Address:  [facility],  [place], [place]      Provider Number: [id number]  Attending Physician Name and Address:  Kerney Elbe, DO  Relative Name and Phone Number:       Current Level of Care: Hospital Recommended Level of Care: Memory Care Prior Approval Number:    Date Approved/Denied:   PASRR Number:    Discharge Plan:  (Memory Care Unit)    Current Diagnoses: Patient Active Problem List   Diagnosis Date Noted  . Saddle embolus ([place]) 04/01/2016  . Suspected UTI 04/01/2016  . Dementia with behavioral disturbance 04/01/2016  . Left leg cellulitis 03/28/2016  . Leg edema 03/28/2016  . Open wound 07/23/2011  . Postop check 07/15/2011  . Squamous cell cancer of skin of buttock 05/26/2011  . Squamous cell cancer, peri-anal 04/30/2011    Orientation RESPIRATION BLADDER Height & Weight     Self  Normal Continent Weight: 165 lb 5.5 oz (75 kg) Height:  5\' 5"  (165.1 cm)  BEHAVIORAL SYMPTOMS/MOOD NEUROLOGICAL BOWEL NUTRITION STATUS      Continent Diet (Heart Healthy)  AMBULATORY STATUS COMMUNICATION OF NEEDS Skin   Limited Assist Verbally Normal                       Personal Care Assistance Level of Assistance  Bathing, Dressing Bathing Assistance: Limited assistance   Dressing Assistance: Limited assistance     Functional Limitations Info             SPECIAL CARE FACTORS FREQUENCY                       Contractures      Additional Factors Info  Code Status, Allergies Code Status Info: DNR Allergies Info: NKDA           Discharge Medications:   Relevant Imaging Results:  Relevant Lab Results:   Additional Information SSN: [id number]  Standley Brooking, LCSW

## 2016-04-02 NOTE — Progress Notes (Signed)
PROGRESS NOTE    [name]  [id number] DOB: 21-Jun-1919 DOA: 03/28/2016 PCP: Laurie Lyons, MD  Brief Narrative:  Laurie Woods a 83 y.o.femalewith medical history significant of dementia and Peri-anal skin cancer. Presented with 2 day history ofsevereleft lower extremity swelling patient has significant dementia may unable to provide further history. Patient  denied any shortness of breath or chest pain.Denies any pain lower extremity did not endorse any history of fevers or chills she is unsure what's making the swelling worse or better. Patient underwent lower extremity doppler and was found to have extensive DVT of left leg extending to left iliac vein.  CTA showing bilateral saddle emboli and signs of right heart strain.  Echocardiogram ordered and performed and did not show signs of right heart strain.  Patient started on coumadin with lovenox bridge. Will remain inpatient until Coumadin is therapeutic on Lovenox Bridge. INR slowly improving.   Assessment & Plan:   Principal Problem:   Saddle embolus (HCC) Active Problems:   Left leg cellulitis   Leg edema   Suspected UTI   Dementia with behavioral disturbance  Bilateral Saddle Submassive Pulmonary Emboli -CT Angio Chest PE showed Positive exam for pulmonary embolism with saddle emboli at the bifurcations of the LEFT and RIGHT pulmonary arteries, with thrombus extending into RIGHT upper lobe and LEFT lower lobe. -CT Evidence of Heart Strain however ECHO showed Normal LV size with moderate LV hypertrophy. EF 65-70%. Normal RV size and systolic function. No significant valvular abnormalities. -Telemetry was D/C'd 2/2 to Agitation and Family Preference -INR still Subtherapeutic at 1.46 -Will bridge to Coumadin with Full Dose Lovenox and will stay inpatient until therapeutic INR;  -Pharmacy to dose Coumadin and will give 6 mg tonight as opposed to 5 mg yesterday. -Monitor INRs Daily and CBC's Daily  -D/C to ALF  when INR is Therapeutic   Left Leg Edema with ?Cellulitis -Leg slightly warm and redness improving, Suspected from DVT -Vascular U/S positive for extensive DVT up to iliac vein -Discussed with vascular surgery- no intervention at this time  -Already on treatment dose lovenox; bridging to coumadin per pharmacy -Monitor INR's daily -On Abx Therapy for suspected UTI  UTI -Urinalysis showed Many Bacteria, Large Leukocytes, TNTC WBC and Negative Nitrite; Per Nurse Urine was very Foul Smelling -Unable to determine if symptomatic because of Dementia -Urine Culture Ordered and showed <10,000 CFU's of Insignificant Growth however nurse stated Urine very foul smelling -C/w Empiric Abx Treatment With IV Ceftriaxone   Dementia -Expect some degree of sundowning while hospitalized -C/w Redirection -Continue Donepezil 10 mg po daily and Memantine XR 28 mg po Daily  -Will try to keep room dark when nighttime and avoid nighttime awakenings -Monitor Closely  DVT prophylaxis: Coumadin with Lovenox Bridge Code Status: DNR/DNI Family Communication: No family present at bedside Disposition Plan: Back to SNF when INR is stable  Consultants:   None   Procedures: None   Antimicrobials: IV Ancef and IV Ceftriaxone given on Admission; IV Ceftriaxone started 1/17 for UTI -->  Subjective: Seen and examined at bedside and was pleasant today and joking about wanting to go dancing but unable to obtain history because of dementia. No complaints as patient is eating well. No overnight events per nursing.   Objective: Vitals:   04/01/16 0435 04/01/16 1323 04/01/16 1936 04/02/16 0704  BP: 118/68 (!) 147/98 (!) 148/86 (!) 135/53  Pulse: 81 73 84 65  Resp: 16 18 17 16   Temp: 99.2 F (37.3 C) 98.3 F (  36.8 C) 97.8 F (36.6 C) 97.5 F (36.4 C)  TempSrc: Oral Oral Oral Oral  SpO2: 93% 95% 100% 100%  Weight:      Height:        Intake/Output Summary (Last 24 hours) at 04/02/16 [id number] Last data filed  at 04/01/16 1717  Gross per 24 hour  Intake              290 ml  Output              700 ml  Net             -410 ml   Filed Weights   03/28/16 1846 03/29/16 0059  Weight: 71.7 kg (158 lb) 75 kg (165 lb 5.5 oz)   Examination: Physical Exam:  Constitutional: WN/WD, NAD and appears calm and comfortable; Pleasantly demented Eyes: Lids and conjunctivae normal, sclerae anicteric  ENMT: External Ears, Nose appear normal. Grossly normal hearing.  Neck: Appears normal, supple, no cervical masses, normal ROM, no appreciable thyromegaly, no JVD Respiratory: Clear to auscultation bilaterally, no wheezing, rales, rhonchi or crackles. Normal respiratory effort and patient is not tachypenic. No accessory muscle use.  Cardiovascular: RRR, no murmurs / rubs / gallops. S1 and S2 auscultated. 1+ Left lower extremity edema with less erythema.  Abdomen: Soft, non-tender, non-distended. No masses palpated. No appreciable hepatosplenomegaly. Bowel sounds positive x4.  GU: Deferred. Musculoskeletal: No clubbing / cyanosis of digits/nails. No joint deformity upper and lower extremities.  Skin:  Warm and dry.  Neurologic: CN 2-12 grossly intact with no focal deficits. Sensation intact in all 4 Extremities,  Psychiatric: Impaired judgment and insight. Alert but not oriented. Normal and pleasant mood and appropriate affect.   Data Reviewed: I have personally reviewed following labs and imaging studies  CBC:  Recent Labs Lab 03/28/16 2049 03/29/16 0544 04/01/16 0515  WBC 16.3* 14.1* 9.7  NEUTROABS 12.9*  --   --   HGB 13.6 13.2 12.7  HCT 41.2 40.3 38.3  MCV 94.7 94.6 94.8  PLT 192 193 [id number]   Basic Metabolic Panel:  Recent Labs Lab 03/28/16 2049 03/29/16 0544 04/02/16 0510  NA 138 141 139  K 4.5 4.2 3.9  CL 104 106 105  CO2 26 30 27   GLUCOSE 134* 124* 96  BUN 23* 20 20  CREATININE 1.09* 1.08* 0.91  CALCIUM 8.4* 8.4* 8.2*  MG  --  2.2 2.4  PHOS  --  2.7 3.4   GFR: Estimated Creatinine  Clearance: 38.3 mL/min (by C-G formula based on SCr of 0.91 mg/dL). Liver Function Tests:  Recent Labs Lab 03/28/16 2049 03/29/16 0544 04/02/16 0510  AST 23 19 23   ALT 19 19 20   ALKPHOS 96 80 85  BILITOT 0.5 0.7 0.5  PROT 6.5 6.3* 5.9*  ALBUMIN 3.4* 3.1* 2.7*   No results for input(s): LIPASE, AMYLASE in the last 168 hours. No results for input(s): AMMONIA in the last 168 hours. Coagulation Profile:  Recent Labs Lab 03/30/16 0532 03/31/16 0519 04/01/16 0515 04/02/16 0510  INR 1.15 1.16 1.27 1.46   Cardiac Enzymes:  Recent Labs Lab 03/29/16 0017 03/29/16 0544 03/29/16 1001  TROPONINI <0.03 <0.03 <0.03   BNP (last 3 results) No results for input(s): PROBNP in the last 8760 hours. HbA1C: No results for input(s): HGBA1C in the last 72 hours. CBG:  Recent Labs Lab 03/30/16 0721  GLUCAP 93   Lipid Profile: No results for input(s): CHOL, HDL, LDLCALC, TRIG, CHOLHDL, LDLDIRECT in the last 72 hours. Thyroid  Function Tests: No results for input(s): TSH, T4TOTAL, FREET4, T3FREE, THYROIDAB in the last 72 hours. Anemia Panel: No results for input(s): VITAMINB12, FOLATE, FERRITIN, TIBC, IRON, RETICCTPCT in the last 72 hours. Sepsis Labs:  Recent Labs Lab 03/28/16 2057  LATICACIDVEN 2.6*    Recent Results (from the past 240 hour(s))  Culture, blood (routine x 2)     Status: None (Preliminary result)   Collection Time: 03/28/16 11:34 PM  Result Value Ref Range Status   Specimen Description BLOOD L ARM  Final   Special Requests BOTTLES DRAWN AEROBIC AND ANAEROBIC 5ML EA  Final   Culture   Final    NO GROWTH 3 DAYS Performed at [facility], [place] [address], [place], [place] [address]    Report Status PENDING  Incomplete  Culture, blood (routine x 2)     Status: None (Preliminary result)   Collection Time: 03/28/16 11:35 PM  Result Value Ref Range Status   Specimen Description BLOOD RIGHT HAND  Final   Special Requests BOTTLES DRAWN AEROBIC AND ANAEROBIC  5ML EA  Final   Culture   Final    NO GROWTH 3 DAYS Performed at [facility], [place] [address] [place], [place] [address]    Report Status PENDING  Incomplete  MRSA PCR Screening     Status: None   Collection Time: 03/29/16  1:24 AM  Result Value Ref Range Status   MRSA by PCR NEGATIVE NEGATIVE Final    Comment:        The GeneXpert MRSA Assay (FDA approved for NASAL specimens only), is one component of a comprehensive MRSA colonization surveillance program. It is not intended to diagnose MRSA infection nor to guide or monitor treatment for MRSA infections.     Radiology Studies: No results found.   Scheduled Meds: . cefTRIAXone (ROCEPHIN)  IV  1 g Intravenous Q24H  . citalopram  20 mg Oral q morning - 10a  . donepezil  10 mg Oral q morning - 10a  . enoxaparin (LOVENOX) injection  70 mg Subcutaneous Q12H  . memantine  28 mg Oral Daily  . polyethylene glycol  17 g Oral Daily  . senna  2 tablet Oral QHS  . sodium chloride flush  3 mL Intravenous Q12H  . warfarin  6 mg Oral ONCE-1800  . warfarin   Does not apply Once  . Warfarin - Pharmacist Dosing Inpatient   Does not apply q1800   Continuous Infusions:   LOS: 5 days   Kerney Elbe, DO [facility] Pager [phone]  If 7PM-7AM, please contact night-coverage [email or web address] Password [email or web address] 04/02/2016, 8:12 AM

## 2016-04-02 NOTE — Clinical Social Work Note (Signed)
Clinical Social Work Assessment  Patient Details  Name: Laurie Woods MRN: [id number] Date of Birth: 31-Aug-1919  Date of referral:  04/02/16               Reason for consult:  Facility Placement                Permission sought to share information with:  Facility Art therapist granted to share information::  Yes, Verbal Permission Granted  Name::        Agency::     Relationship::     Contact Information:     Housing/Transportation Living arrangements for the past 2 months:  [facility] (Memory Care Unit) Source of Information:  Adult Children Patient Interpreter Needed:  None Criminal Activity/Legal Involvement Pertinent to Current Situation/Hospitalization:  No - Comment as needed Significant Relationships:  Adult Children Lives with:  Facility Resident Do you feel safe going back to the place where you live?  Yes Need for family participation in patient care:  Yes (Comment)  Care giving concerns:  CSW received consult that patient was admitted from [facility].    Social Worker assessment / plan:  CSW confirmed with patient's daughter, Laurie Woods via phone that they plan for patient to return to [facility] at discharge. CSW spoke with Loa Socks at [facility] to confirm that they would be able to take patient back once back on coumadin - anticipating possible discharge tomorrow if INR is stable per MD notes.   Employment status:  Retired Forensic scientist:  Medicare PT Recommendations:  Not assessed at this time [place] / Referral to community resources:     Patient/Family's Response to care:    Patient/Family's Understanding of and Emotional Response to Diagnosis, Current Treatment, and Prognosis:    Emotional Assessment Appearance:    Attitude/Demeanor/Rapport:    Affect (typically observed):    Orientation:  Oriented to Self Alcohol / Substance use:    Psych involvement (Current and /or in the community):      Discharge Needs  Concerns to be addressed:    Readmission within the last 30 days:    Current discharge risk:    Barriers to Discharge:      Standley Brooking, LCSW 04/02/2016, 3:02 PM

## 2016-04-02 NOTE — Progress Notes (Addendum)
[place] for Lovenox, Warfarin Indication: new pulmonary embolus and DVT  No Known Allergies  Patient Measurements: Height: 5\' 5"  (165.1 cm) Weight: 165 lb 5.5 oz (75 kg) IBW/kg (Calculated) : 57  Vital Signs: Temp: 97.5 F (36.4 C) (01/18 0704) Temp Source: Oral (01/18 0704) BP: 135/53 (01/18 0704) Pulse Rate: 65 (01/18 0704)  Labs:  Recent Labs  03/31/16 0519 04/01/16 0515 04/02/16 0510  HGB  --  12.7  --   HCT  --  38.3  --   PLT  --  303  --   LABPROT 14.9 16.0* 17.9*  INR 1.16 1.27 1.46  CREATININE  --   --  0.91    Estimated Creatinine Clearance: 38.3 mL/min (by C-G formula based on SCr of 0.91 mg/dL).   Medications:  Scheduled:  . cefTRIAXone (ROCEPHIN)  IV  1 g Intravenous Q24H  . citalopram  20 mg Oral q morning - 10a  . donepezil  10 mg Oral q morning - 10a  . enoxaparin (LOVENOX) injection  70 mg Subcutaneous Q12H  . memantine  28 mg Oral Daily  . polyethylene glycol  17 g Oral Daily  . senna  2 tablet Oral QHS  . sodium chloride flush  3 mL Intravenous Q12H  . warfarin   Does not apply Once  . Warfarin - Pharmacist Dosing Inpatient   Does not apply q1800    Assessment: 19 yoF admitted on 1/13 with hx of dementia and peri-anal skin cancer now with severe LLE swelling.  She's currently on lovenox and warfarin for new DVT/PE.  - 1/14 Vascular US: positive for LLE DVT - 1/14 CTa: positive for saddle emboli at the bifurcations of the LEFT and RIGHT pulmonary arteries, with thrombus extending into RIGHT upper lobe and LEFT lower lobe.  Not a tPA candidate per PCCM.   Today, 04/02/2016:  INR: 1.46, remains subtherapeutic but increasing slowly  CBC: Hgb and Plt stable/WNL  SCr 0.91 with CrCl ~ 38 ml/min  Diet: heart healthy, eating 25-75% of meal tray  No major drug-drug interactions noted.  Goal of Therapy:  INR 2-3 Anti-Xa level 0.6-1 units/ml 4hrs after LMWH dose given Monitor platelets by  anticoagulation protocol: Yes   Plan:   Continue Lovenox 70 mg SQ q12h.  Continue Lovenox/Warfarin overlap until INR > 2 for 24 hours.  Warfarin 7.5 mg PO x 1 at 1800 - boosted dose to prepare for discharge.  Monitor for signs and symptoms of bleeding.  Educated patient's daughter on warfarin on 03/30/16   Gretta Arab PharmD, BCPS Pager [phone] 04/02/2016 7:47 AM

## 2016-04-03 LAB — CULTURE, BLOOD (ROUTINE X 2)
Culture: NO GROWTH
Culture: NO GROWTH

## 2016-04-03 LAB — PROTIME-INR
INR: 1.6
PROTHROMBIN TIME: 19.2 s — AB (ref 11.4–15.2)

## 2016-04-03 MED ORDER — WARFARIN SODIUM 5 MG PO TABS
7.5000 mg | ORAL_TABLET | Freq: Once | ORAL | Status: AC
  Administered 2016-04-03: 17:00:00 7.5 mg via ORAL
  Filled 2016-04-03: qty 1

## 2016-04-03 MED ORDER — FUROSEMIDE 20 MG PO TABS
20.0000 mg | ORAL_TABLET | Freq: Once | ORAL | Status: AC
  Administered 2016-04-03: 20 mg via ORAL
  Filled 2016-04-03: qty 1

## 2016-04-03 NOTE — Progress Notes (Signed)
[place] for Lovenox, Warfarin Indication: new pulmonary embolus and DVT  No Known Allergies  Patient Measurements: Height: 5\' 5"  (165.1 cm) Weight: 165 lb 5.5 oz (75 kg) IBW/kg (Calculated) : 57  Vital Signs: Temp: 98.1 F (36.7 C) (01/19 0459) Temp Source: Oral (01/19 0459) BP: 138/64 (01/19 0459) Pulse Rate: 70 (01/19 0459)  Labs:  Recent Labs  04/01/16 0515 04/02/16 0510 04/03/16 0445  HGB 12.7 13.0  --   HCT 38.3 39.5  --   PLT 303 304  --   LABPROT 16.0* 17.9* 19.2*  INR 1.27 1.46 1.60  CREATININE  --  0.91  --     Estimated Creatinine Clearance: 38.3 mL/min (by C-G formula based on SCr of 0.91 mg/dL).   Medications:  Scheduled:  . cefTRIAXone (ROCEPHIN)  IV  1 g Intravenous Q24H  . citalopram  20 mg Oral q morning - 10a  . donepezil  10 mg Oral q morning - 10a  . enoxaparin (LOVENOX) injection  70 mg Subcutaneous Q12H  . memantine  28 mg Oral Daily  . polyethylene glycol  17 g Oral Daily  . senna  2 tablet Oral QHS  . sodium chloride flush  3 mL Intravenous Q12H  . warfarin   Does not apply Once  . Warfarin - Pharmacist Dosing Inpatient   Does not apply q1800    Assessment: 69 yoF admitted on 1/13 with hx of dementia and peri-anal skin cancer now with severe LLE swelling.  She's currently on lovenox and warfarin for new DVT/PE.  - 1/14 Vascular US: positive for LLE DVT - 1/14 CTa: positive for saddle emboli at the bifurcations of the LEFT and RIGHT pulmonary arteries, with thrombus extending into RIGHT upper lobe and LEFT lower lobe.  Not a tPA candidate per PCCM.   Today, 04/03/2016:  INR remains subtherapeutic at 1.60 but trending up towards goal range  CBC on 1/18: Hgb and Plt stable/WNL  SCr 0.91 with CrCl ~ 38 ml/min  Diet: heart healthy, eating 25-75% of meal tray  drug-drug interactions: on ceftriaxone (abx can increase INR)  Goal of Therapy:  INR 2-3 Anti-Xa level 0.6-1 units/ml 4hrs after LMWH  dose given Monitor platelets by anticoagulation protocol: Yes   Plan:   Continue Lovenox 70 mg SQ q12h.  Continue Lovenox/Warfarin overlap until INR > 2 for 24 hours.  Repeat Warfarin 7.5 mg PO x 1 at 1800  Monitor for signs and symptoms of bleeding.  Educated patient's daughter on warfarin on 03/30/16   Dia Sitter, PharmD, BCPS 04/03/2016 7:45 AM

## 2016-04-03 NOTE — Progress Notes (Signed)
PT Cancellation Note  Patient Details Name: Laurie Woods MRN: [id number] DOB: [birth year]   Cancelled Treatment:    Reason Eval/Treat Not Completed: Medical issues which prohibited therapy Pt found to have extensive DVT of left leg extending to left iliac vein. CTA showing bilateral saddle emboli and signs of right heart strain.  INR is currently subtherapeutic.  Will await therapeutic level prior to mobilizing.   Clare Fennimore,KATHrine E 04/03/2016, 10:06 AM Carmelia Bake, PT, DPT 04/03/2016 Pager: [phone]

## 2016-04-03 NOTE — Progress Notes (Signed)
PROGRESS NOTE    Laurie Woods  [id number] DOB: 10-Jul-1919 DOA: 03/28/2016 PCP: Geoffery Lyons, MD  Brief Narrative:  Laurie Woods a 83 y.o.femalewith medical history significant of dementia and Peri-anal skin cancer. Presented with 2 day history ofsevereleft lower extremity swelling patient has significant dementia may unable to provide further history. Patient denied any shortness of breath or chest pain.Denies any pain lower extremity did not endorse any history of fevers or chills she is unsure what's making the swelling worse or better. Patient underwent lower extremity doppler and was found to have extensive DVT of left leg extending to left iliac vein.  CTA showing bilateral saddle emboli and signs of right heart strain.  Echocardiogram ordered and performed and did not show signs of right heart strain.  Patient started on coumadin with lovenox bridge. Will remain inpatient until Coumadin is therapeutic on Lovenox Bridge. INR slowly improving. PT consulted for evaluation.   Assessment & Plan:   Principal Problem:   Saddle embolus (HCC) Active Problems:   Left leg cellulitis   Leg edema   Suspected UTI   Dementia with behavioral disturbance  Bilateral Saddle Submassive Pulmonary Emboli -CT Angio Chest PE showed Positive exam for pulmonary embolism with saddle emboli at the bifurcations of the LEFT and RIGHT pulmonary arteries, with thrombus extending into RIGHT upper lobe and LEFT lower lobe. -CT Evidence of Heart Strain however ECHO showed Normal LV size with moderate LV hypertrophy. EF 65-70%. Normal RV size and systolic function. No significant valvular abnormalities. -Telemetry was D/C'd 2/2 to Agitation and Family Preference -INR still Subtherapeutic at 1.60 and slowly improving -Will bridge to Coumadin with Full Dose Lovenox and will stay inpatient until therapeutic INR;  -Pharmacy to dose Coumadin and will give 7.5 mg tonight as opposed to 6 mg  yesterday. -Monitor INRs Daily and CBC's Daily  -Will obtain PT Evaluation prior to D/C to ALF to see if patient does not require SNF  Left Leg Edema with ?Cellulitis -Leg slightly warm and redness improving, Suspected from DVT -Vascular U/S positive for extensive DVT up to iliac vein -Discussed with vascular surgery- no intervention at this time  -Already on treatment dose lovenox; bridging to coumadin per pharmacy -Monitor INR's daily -Will give Lasix 20 mg po x 1 dose and re-evaluate Leg in AM -On Abx Therapy for suspected UTI   UTI -Urinalysis showed Many Bacteria, Large Leukocytes, TNTC WBC and Negative Nitrite; Per Nurse Urine was very Foul Smelling -Unable to determine if symptomatic because of Dementia -Urine Culture Ordered  However patient had already received Abx and showed <10,000 CFU's of Insignificant Growth however nurse stated Urine very foul smelling -C/w Empiric Abx Treatment With IV Ceftriaxone   Dementia -Expect some degree of sundowning while hospitalized -C/w Redirection -Continue Donepezil 10 mg po daily and Memantine XR 28 mg po Daily  -Will try to keep room dark when nighttime and avoid nighttime awakenings -Monitor Closely  DVT prophylaxis: Coumadin with Lovenox Bridge Code Status: DNR/DNI Family Communication: No family present at bedside Disposition Plan: Pending PT Evaluation; If does not require SNF will go back to Memory Care ALF  Consultants:   None   Procedures: None   Antimicrobials: IV Ancef and IV Ceftriaxone given on Admission; IV Ceftriaxone started 1/17 for UTI --> 04/03/16  Subjective: Seen and examined at bedside and unable to obtain history because of dementia. No complaints as patient is eating well. Very pleasant to speak with. No overnight events per nursing however nurse feels  patient is weak and would benefit from PT Evaluation. No other concerns.   Objective: Vitals:   04/02/16 0704 04/02/16 1316 04/02/16 1948 04/03/16 0459   BP: (!) 135/53 122/74 134/64 138/64  Pulse: 65 (!) 50 70 70  Resp: 16 18 18 17   Temp: 97.5 F (36.4 C) 97.7 F (36.5 C) 98.2 F (36.8 C) 98.1 F (36.7 C)  TempSrc: Oral Oral Oral Oral  SpO2: 100% 90% 95% 98%  Weight:      Height:        Intake/Output Summary (Last 24 hours) at 04/03/16 0754 Last data filed at 04/02/16 2147  Gross per 24 hour  Intake              710 ml  Output              450 ml  Net              260 ml   Filed Weights   03/28/16 1846 03/29/16 0059  Weight: 71.7 kg (158 lb) 75 kg (165 lb 5.5 oz)   Examination: Physical Exam:  Constitutional: WN/WD, NAD and appears calm and comfortable; Pleasantly demented Eyes: Lids and conjunctivae normal, sclerae anicteric  ENMT: External Ears, Nose appear normal. Grossly normal hearing.  Neck: Appears normal, supple, no cervical masses, normal ROM, no appreciable thyromegaly, no JVD Respiratory: Clear to auscultation bilaterally, no wheezing, rales, rhonchi or crackles. Normal respiratory effort and patient is not tachypenic. No accessory muscle use.  Cardiovascular: RRR, no murmurs / rubs / gallops. S1 and S2 auscultated. 2+ Left lower extremity edema with some erythema.  Abdomen: Soft, non-tender, non-distended. No masses palpated. No appreciable hepatosplenomegaly. Bowel sounds positive x4.  GU: Deferred. Musculoskeletal: No clubbing / cyanosis of digits/nails. No joint deformity upper and lower extremities.  Skin:  Warm and dry.  Neurologic: CN 2-12 grossly intact with no focal deficits. Sensation intact in all 4 Extremities,  Psychiatric: Impaired judgment and insight. Alert but not oriented. Normal and pleasant mood and appropriate affect.   Data Reviewed: I have personally reviewed following labs and imaging studies  CBC:  Recent Labs Lab 03/28/16 2049 03/29/16 0544 04/01/16 0515 04/02/16 0510  WBC 16.3* 14.1* 9.7 9.2  NEUTROABS 12.9*  --   --  5.9  HGB 13.6 13.2 12.7 13.0  HCT 41.2 40.3 38.3 39.5   MCV 94.7 94.6 94.8 95.4  PLT 192 193 303 [id number]   Basic Metabolic Panel:  Recent Labs Lab 03/28/16 2049 03/29/16 0544 04/02/16 0510  NA 138 141 139  K 4.5 4.2 3.9  CL 104 106 105  CO2 26 30 27   GLUCOSE 134* 124* 96  BUN 23* 20 20  CREATININE 1.09* 1.08* 0.91  CALCIUM 8.4* 8.4* 8.2*  MG  --  2.2 2.4  PHOS  --  2.7 3.4   GFR: Estimated Creatinine Clearance: 38.3 mL/min (by C-G formula based on SCr of 0.91 mg/dL). Liver Function Tests:  Recent Labs Lab 03/28/16 2049 03/29/16 0544 04/02/16 0510  AST 23 19 23   ALT 19 19 20   ALKPHOS 96 80 85  BILITOT 0.5 0.7 0.5  PROT 6.5 6.3* 5.9*  ALBUMIN 3.4* 3.1* 2.7*   No results for input(s): LIPASE, AMYLASE in the last 168 hours. No results for input(s): AMMONIA in the last 168 hours. Coagulation Profile:  Recent Labs Lab 03/30/16 0532 03/31/16 0519 04/01/16 0515 04/02/16 0510 04/03/16 0445  INR 1.15 1.16 1.27 1.46 1.60   Cardiac Enzymes:  Recent Labs Lab 03/29/16 0017  03/29/16 0544 03/29/16 1001  TROPONINI <0.03 <0.03 <0.03   BNP (last 3 results) No results for input(s): PROBNP in the last 8760 hours. HbA1C: No results for input(s): HGBA1C in the last 72 hours. CBG:  Recent Labs Lab 03/30/16 0721  GLUCAP 93   Lipid Profile: No results for input(s): CHOL, HDL, LDLCALC, TRIG, CHOLHDL, LDLDIRECT in the last 72 hours. Thyroid Function Tests: No results for input(s): TSH, T4TOTAL, FREET4, T3FREE, THYROIDAB in the last 72 hours. Anemia Panel: No results for input(s): VITAMINB12, FOLATE, FERRITIN, TIBC, IRON, RETICCTPCT in the last 72 hours. Sepsis Labs:  Recent Labs Lab 03/28/16 2057  LATICACIDVEN 2.6*    Recent Results (from the past 240 hour(s))  Culture, blood (routine x 2)     Status: None (Preliminary result)   Collection Time: 03/28/16 11:34 PM  Result Value Ref Range Status   Specimen Description BLOOD L ARM  Final   Special Requests BOTTLES DRAWN AEROBIC AND ANAEROBIC 5ML EA  Final   Culture    Final    NO GROWTH 4 DAYS Performed at [facility], [place] [address], [place], [place] [address]    Report Status PENDING  Incomplete  Culture, blood (routine x 2)     Status: None (Preliminary result)   Collection Time: 03/28/16 11:35 PM  Result Value Ref Range Status   Specimen Description BLOOD RIGHT HAND  Final   Special Requests BOTTLES DRAWN AEROBIC AND ANAEROBIC 5ML EA  Final   Culture   Final    NO GROWTH 4 DAYS Performed at [facility], [place] [address], [place], [place] [address]    Report Status PENDING  Incomplete  MRSA PCR Screening     Status: None   Collection Time: 03/29/16  1:24 AM  Result Value Ref Range Status   MRSA by PCR NEGATIVE NEGATIVE Final    Comment:        The GeneXpert MRSA Assay (FDA approved for NASAL specimens only), is one component of a comprehensive MRSA colonization surveillance program. It is not intended to diagnose MRSA infection nor to guide or monitor treatment for MRSA infections.   Culture, Urine     Status: Abnormal   Collection Time: 03/31/16  9:03 AM  Result Value Ref Range Status   Specimen Description URINE, RANDOM  Final   Special Requests NONE  Final   Culture (A)  Final    <10,000 COLONIES/mL INSIGNIFICANT GROWTH Performed at [facility], [address] [place], [place] [address]    Report Status 04/02/2016 FINAL  Final    Radiology Studies: No results found.   Scheduled Meds: . cefTRIAXone (ROCEPHIN)  IV  1 g Intravenous Q24H  . citalopram  20 mg Oral q morning - 10a  . donepezil  10 mg Oral q morning - 10a  . enoxaparin (LOVENOX) injection  70 mg Subcutaneous Q12H  . memantine  28 mg Oral Daily  . polyethylene glycol  17 g Oral Daily  . senna  2 tablet Oral QHS  . sodium chloride flush  3 mL Intravenous Q12H  . warfarin  7.5 mg Oral ONCE-1800  . warfarin   Does not apply Once  . Warfarin - Pharmacist Dosing Inpatient   Does not apply q1800   Continuous Infusions:   LOS: 6 days    Kerney Elbe, DO [facility] Pager [phone]  If 7PM-7AM, please contact night-coverage [email or web address] Password [facility] 04/03/2016, 7:54 AM

## 2016-04-03 NOTE — Care Management Important Message (Signed)
Important Message  Patient Details  Name: ADAIJAH PARVEEN MRN: [id number] Date of Birth: 1919/10/07   Medicare Important Message Given:  Yes    Kerin Salen 04/03/2016, 2:07 [place] Message  Patient Details  Name: SAISHA GRAPES MRN: [id number] Date of Birth: [birth year]   Medicare Important Message Given:  Yes    Kerin Salen 04/03/2016, 2:07 PM

## 2016-04-03 NOTE — Evaluation (Signed)
Physical Therapy Evaluation Patient Details Name: Laurie Woods MRN: [id number] DOB: 28-Feb-1920 Today's Date: 04/03/2016   History of Present Illness  83 y.o. female with medical history significant of dementia and Peri-anal skin cancer. Presented with 2 day history of severe left lower extremity swelling patient has significant dementia may unable to provide further history. Patient denied any shortness of breath or chest pain. Denies any pain lower extremity did not endorse any history of fevers or chills she is unsure what's making the swelling worse or better. Patient underwent lower extremity doppler and was found to have extensive DVT of left leg extending to left iliac vein.  CTA showing bilateral saddle emboli and signs of right heart strain.  Echocardiogram ordered and performed and did not show signs of right heart strain.  Patient started on coumadin with lovenox bridge.  Pt not yet with therapeutic INR however MD would like PT to evaluate for D/C plans.  Clinical Impression  Pt admitted with above diagnosis. Pt currently with functional limitations due to the deficits listed below (see PT Problem List). Pt will benefit from skilled PT to increase their independence and safety with mobility to allow discharge to the venue listed below.  Pt poor historian and admitted from ALF per chart.  Pt ambulatory with min/guard assist today.  Pt utilizes hand rail to self steady and suspect this is her baseline mobility.  Would recommend supervision for safety due to cognition (from memory care unit per chart).     Follow Up Recommendations No PT follow up;Supervision for mobility/OOB    Equipment Recommendations  None recommended by PT    Recommendations for Other Services       Precautions / Restrictions Precautions Precautions: Fall      Mobility  Bed Mobility Overal bed mobility: Needs Assistance Bed Mobility: Sit to Supine       Sit to supine: Supervision       Transfers Overall transfer level: Needs assistance Equipment used: None Transfers: Sit to/from Stand Sit to Stand: Min guard         General transfer comment: increased time and effort, UE required to self assist  Ambulation/Gait Ambulation/Gait assistance: Min guard Ambulation Distance (Feet): 200 Feet Assistive device: None Gait Pattern/deviations: Step-through pattern;Decreased stride length     General Gait Details: pt ambulated near hallway railing and utilized to self steady - this is likely baseline, 2 short standing rest breaks due to L LE pain  Stairs            Wheelchair Mobility    Modified Rankin (Stroke Patients Only)       Balance                                             Pertinent Vitals/Pain Pain Assessment: Faces Faces Pain Scale: Hurts a little bit Pain Location: L LE Pain Descriptors / Indicators: Sore Pain Intervention(s): Monitored during session;Repositioned    Home Living Family/patient expects to be discharged to:: Assisted living                 Additional Comments: from memory care unit per chart, pt poor historian    Prior Function Level of Independence: Needs assistance   Gait / Transfers Assistance Needed: ambulatory without assistive device and from memory care per RN           Hand Dominance  Extremity/Trunk Assessment        Lower Extremity Assessment Lower Extremity Assessment: Generalized weakness;LLE deficits/detail LLE Deficits / Details: edematous thigh and lower leg       Communication   Communication: No difficulties  Cognition Arousal/Alertness: Awake/alert Behavior During Therapy: WFL for tasks assessed/performed Overall Cognitive Status: History of cognitive impairments - at baseline                 General Comments: hx of dementia, follows commands    General Comments      Exercises     Assessment/Plan    PT Assessment Patient needs  continued PT services  PT Problem List Decreased mobility;Decreased activity tolerance;Pain;Decreased cognition          PT Treatment Interventions DME instruction;Gait training;Therapeutic activities;Therapeutic exercise;Functional mobility training;Patient/family education    PT Goals (Current goals can be found in the Care Plan section)  Acute Rehab PT Goals PT Goal Formulation: Patient unable to participate in goal setting Time For Goal Achievement: 04/10/16 Potential to Achieve Goals: Good    Frequency Min 2X/week   Barriers to discharge        Co-evaluation               End of Session Equipment Utilized During Treatment: Gait belt Activity Tolerance: Patient tolerated treatment well Patient left: in bed;with call bell/phone within reach;with bed alarm set Nurse Communication: Mobility status         Time: [id number] PT Time Calculation (min) (ACUTE ONLY): 16 min   Charges:   PT Evaluation $PT Eval Low Complexity: 1 Procedure     PT G Codes:        Aarion Metzgar,KATHrine E 04/03/2016, 1:50 PM Carmelia Bake, PT, DPT 04/03/2016 Pager: [phone]

## 2016-04-03 NOTE — Progress Notes (Signed)
CSW spoke with patient's daughter, Collie Siad re: discharge planning - still awaiting therapeutic INR, then PT will work with patient to determine disposition. Patient was admitted from [facility] (ph#: [phone]). Will await PT eval for recommendation.    Raynaldo Opitz, [facility] Clinical Social Worker cell #: [phone]

## 2016-04-04 DIAGNOSIS — N898 Other specified noninflammatory disorders of vagina: Secondary | ICD-10-CM

## 2016-04-04 LAB — CBC
HCT: 40 % (ref 36.0–46.0)
Hemoglobin: 12.9 g/dL (ref 12.0–15.0)
MCH: 30.7 pg (ref 26.0–34.0)
MCHC: 32.3 g/dL (ref 30.0–36.0)
MCV: 95.2 fL (ref 78.0–100.0)
PLATELETS: 365 10*3/uL (ref 150–400)
RBC: 4.2 MIL/uL (ref 3.87–5.11)
RDW: 12.9 % (ref 11.5–15.5)
WBC: 6.8 10*3/uL (ref 4.0–10.5)

## 2016-04-04 LAB — PROTIME-INR
INR: 1.9
PROTHROMBIN TIME: 22 s — AB (ref 11.4–15.2)

## 2016-04-04 MED ORDER — FUROSEMIDE 20 MG PO TABS
20.0000 mg | ORAL_TABLET | Freq: Once | ORAL | Status: AC
  Administered 2016-04-04: 20 mg via ORAL
  Filled 2016-04-04: qty 1

## 2016-04-04 MED ORDER — WARFARIN SODIUM 5 MG PO TABS
7.5000 mg | ORAL_TABLET | Freq: Once | ORAL | Status: AC
  Administered 2016-04-04: 17:00:00 7.5 mg via ORAL
  Filled 2016-04-04: qty 1

## 2016-04-04 NOTE — Progress Notes (Signed)
Pt noted to have maroon vaginal drainage when assisted up to BR.  Drainage noted to inside of thighs and on pt's bedpad.  Pt denies pain/burning.  Dr Alfredia Ferguson aware.

## 2016-04-04 NOTE — Progress Notes (Signed)
PROGRESS NOTE    NEIMA DYER  [id number] DOB: 1919-04-29 DOA: 03/28/2016 PCP: Geoffery Lyons, MD  Brief Narrative:  [name] a 83 y.o.femalewith medical history significant of dementia and Peri-anal skin cancer. Presented with 2 day history ofsevereleft lower extremity swelling patient has significant dementia may unable to provide further history. Patient denied any shortness of breath or chest pain.Denies any pain lower extremity did not endorse any history of fevers or chills she is unsure what's making the swelling worse or better. Patient underwent lower extremity doppler and was found to have extensive DVT of left leg extending to left iliac vein.  CTA showing bilateral saddle emboli and signs of right heart strain.  Echocardiogram ordered and performed and did not show signs of right heart strain.  Patient started on coumadin with lovenox bridge. Will remain inpatient until Coumadin is therapeutic on Lovenox Bridge. INR slowly improving. PT consulted for evaluation and Patient does not require SNF. This AM patient was noted to have maroon vaginal discharge when ambulating to the restroom. H/H stable.   Assessment & Plan:   Principal Problem:   Saddle embolus (HCC) Active Problems:   Left leg cellulitis   Leg edema   Suspected UTI   Dementia with behavioral disturbance  Bilateral Saddle Submassive Pulmonary Emboli -CT Angio Chest PE showed Positive exam for pulmonary embolism with saddle emboli at the bifurcations of the LEFT and RIGHT pulmonary arteries, with thrombus extending into RIGHT upper lobe and LEFT lower lobe. -CT Evidence of Heart Strain however ECHO showed Normal LV size with moderate LV hypertrophy. EF 65-70%. Normal RV size and systolic function. No significant valvular abnormalities. -Telemetry was D/C'd 2/2 to Agitation and Family Preference -INR still Subtherapeutic at 1.90 and improving -Will bridge to Coumadin with Full Dose Lovenox and  will stay inpatient until therapeutic INR;  -Pharmacy to dose Coumadin and will give 7.5 mg of po Warfarin again -Monitor INRs Daily and CBC's Daily  -PT Evaluation revealed no Skilled needs and will be D/C'd to ALF once ready for discharge  Left Leg Edema with ?Cellulitis -Leg slightly warm and redness improving, Suspected from DVT -Vascular U/S positive for extensive DVT up to iliac vein -Discussed with vascular surgery- no intervention at this time  -Already on treatment dose lovenox; bridging to coumadin per pharmacy -Monitor INR's daily -Will repeat Lasix 20 mg po x 1 dose and re-evaluate Leg in AM -Abx D/C'd  UTI, treated -Urinalysis showed Many Bacteria, Large Leukocytes, TNTC WBC and Negative Nitrite; Per Nurse Urine was very Foul Smelling -Unable to determine if symptomatic because of Dementia -Urine Culture Ordered  However patient had already received Abx and showed <10,000 CFU's of Insignificant Growth however nurse stated Urine very foul smelling -Abx Treatment With IV Ceftriaxone Discontinued  Maroon Vaginal Discharge -Patient is on Coumadin and Lovenox Bridge -Asymptomatic and Hb/Hct Stable at 12.9/40.0; ? Vaginal Atrophy and Thinning exacerbated by Anticoagulants -Repeat CBC in AM and obtain FOBT -Outpatient follow up with Gyencology  -Continue to Monitor closely for Additional S/Sx of Bleeding  Dementia -Expect some degree of sundowning while hospitalized -C/w Redirection -Continue Donepezil 10 mg po daily and Memantine XR 28 mg po Daily  -Will try to keep room dark when nighttime and avoid nighttime awakenings -Monitor Closely  DVT prophylaxis: Coumadin with Lovenox Bridge Code Status: DNR/DNI Family Communication: No family present at bedside Disposition Plan: ALF if INR is impoved  Consultants:   None   Procedures: None   Antimicrobials: IV Ancef  and IV Ceftriaxone given on Admission; IV Ceftriaxone started 1/17 for UTI -->  04/03/16  Subjective: Seen and examined at bedside and unable to obtain history because of dementia. Patient is very pleasant to speak with. No complaints however when nurse ambulated patient to restroom noted maroon vaginal discharge. Patient asymptomatic.   Objective: Vitals:   04/03/16 0459 04/03/16 1300 04/03/16 2031 04/04/16 0626  BP: 138/64 122/70 137/70 109/78  Pulse: 70 74 77 66  Resp: 17 18 16 16   Temp: 98.1 F (36.7 C) 98.4 F (36.9 C) 98.2 F (36.8 C) 98.1 F (36.7 C)  TempSrc: Oral Oral Oral Oral  SpO2: 98% 98% 99% 94%  Weight:      Height:        Intake/Output Summary (Last 24 hours) at 04/04/16 0750 Last data filed at 04/03/16 1403  Gross per 24 hour  Intake              290 ml  Output                0 ml  Net              290 ml   Filed Weights   03/28/16 1846 03/29/16 0059  Weight: 71.7 kg (158 lb) 75 kg (165 lb 5.5 oz)   Examination: Physical Exam:  Constitutional: WN/WD, NAD and appears calm and comfortable; Pleasantly demented Eyes: Lids and conjunctivae normal, sclerae anicteric  ENMT: External Ears, Nose appear normal. Grossly normal hearing.  Neck: Appears normal, supple, no cervical masses, normal ROM, no appreciable thyromegaly, no JVD Respiratory: Clear to auscultation bilaterally, no wheezing, rales, rhonchi or crackles. Normal respiratory effort and patient is not tachypenic. No accessory muscle use.  Cardiovascular: RRR, no murmurs / rubs / gallops. S1 and S2 auscultated. 2+ Left lower extremity edema with mild erythema.  Abdomen: Soft, non-tender, non-distended. No masses palpated. No appreciable hepatosplenomegaly. Bowel sounds positive x4.  GU: Deferred.  Musculoskeletal: No clubbing / cyanosis of digits/nails. No joint deformity upper and lower extremities.  Skin:  Warm and dry.  Neurologic: CN 2-12 grossly intact with no focal deficits. Sensation intact in all 4 Extremities,  Psychiatric: Impaired judgment and insight. Alert but not  oriented. Normal and pleasant mood and appropriate affect.   Data Reviewed: I have personally reviewed following labs and imaging studies  CBC:  Recent Labs Lab 03/28/16 2049 03/29/16 0544 04/01/16 0515 04/02/16 0510 04/04/16 0511  WBC 16.3* 14.1* 9.7 9.2 6.8  NEUTROABS 12.9*  --   --  5.9  --   HGB 13.6 13.2 12.7 13.0 12.9  HCT 41.2 40.3 38.3 39.5 40.0  MCV 94.7 94.6 94.8 95.4 95.2  PLT 192 193 303 304 [id number]   Basic Metabolic Panel:  Recent Labs Lab 03/28/16 2049 03/29/16 0544 04/02/16 0510  NA 138 141 139  K 4.5 4.2 3.9  CL 104 106 105  CO2 26 30 27   GLUCOSE 134* 124* 96  BUN 23* 20 20  CREATININE 1.09* 1.08* 0.91  CALCIUM 8.4* 8.4* 8.2*  MG  --  2.2 2.4  PHOS  --  2.7 3.4   GFR: Estimated Creatinine Clearance: 38.3 mL/min (by C-G formula based on SCr of 0.91 mg/dL). Liver Function Tests:  Recent Labs Lab 03/28/16 2049 03/29/16 0544 04/02/16 0510  AST 23 19 23   ALT 19 19 20   ALKPHOS 96 80 85  BILITOT 0.5 0.7 0.5  PROT 6.5 6.3* 5.9*  ALBUMIN 3.4* 3.1* 2.7*   No results for input(s):  LIPASE, AMYLASE in the last 168 hours. No results for input(s): AMMONIA in the last 168 hours. Coagulation Profile:  Recent Labs Lab 03/31/16 0519 04/01/16 0515 04/02/16 0510 04/03/16 0445 04/04/16 0511  INR 1.16 1.27 1.46 1.60 1.90   Cardiac Enzymes:  Recent Labs Lab 03/29/16 0017 03/29/16 0544 03/29/16 1001  TROPONINI <0.03 <0.03 <0.03   BNP (last 3 results) No results for input(s): PROBNP in the last 8760 hours. HbA1C: No results for input(s): HGBA1C in the last 72 hours. CBG:  Recent Labs Lab 03/30/16 0721  GLUCAP 93   Lipid Profile: No results for input(s): CHOL, HDL, LDLCALC, TRIG, CHOLHDL, LDLDIRECT in the last 72 hours. Thyroid Function Tests: No results for input(s): TSH, T4TOTAL, FREET4, T3FREE, THYROIDAB in the last 72 hours. Anemia Panel: No results for input(s): VITAMINB12, FOLATE, FERRITIN, TIBC, IRON, RETICCTPCT in the last 72  hours. Sepsis Labs:  Recent Labs Lab 03/28/16 2057  LATICACIDVEN 2.6*    Recent Results (from the past 240 hour(s))  Culture, blood (routine x 2)     Status: None   Collection Time: 03/28/16 11:34 PM  Result Value Ref Range Status   Specimen Description BLOOD L ARM  Final   Special Requests BOTTLES DRAWN AEROBIC AND ANAEROBIC 5ML EA  Final   Culture   Final    NO GROWTH 5 DAYS Performed at [facility], [address], [place], [place] [address]    Report Status 04/03/2016 FINAL  Final  Culture, blood (routine x 2)     Status: None   Collection Time: 03/28/16 11:35 PM  Result Value Ref Range Status   Specimen Description BLOOD RIGHT HAND  Final   Special Requests BOTTLES DRAWN AEROBIC AND ANAEROBIC 5ML EA  Final   Culture   Final    NO GROWTH 5 DAYS Performed at [facility], [place] [address], [place], [place] [address]    Report Status 04/03/2016 FINAL  Final  MRSA PCR Screening     Status: None   Collection Time: 03/29/16  1:24 AM  Result Value Ref Range Status   MRSA by PCR NEGATIVE NEGATIVE Final    Comment:        The GeneXpert MRSA Assay (FDA approved for NASAL specimens only), is one component of a comprehensive MRSA colonization surveillance program. It is not intended to diagnose MRSA infection nor to guide or monitor treatment for MRSA infections.   Culture, Urine     Status: Abnormal   Collection Time: 03/31/16  9:03 AM  Result Value Ref Range Status   Specimen Description URINE, RANDOM  Final   Special Requests NONE  Final   Culture (A)  Final    <10,000 COLONIES/mL INSIGNIFICANT GROWTH Performed at [facility], [address] [place], [place] [address]    Report Status 04/02/2016 FINAL  Final    Radiology Studies: No results found.   Scheduled Meds: . citalopram  20 mg Oral q morning - 10a  . donepezil  10 mg Oral q morning - 10a  . enoxaparin (LOVENOX) injection  70 mg Subcutaneous Q12H  . memantine  28 mg Oral Daily  .  polyethylene glycol  17 g Oral Daily  . senna  2 tablet Oral QHS  . sodium chloride flush  3 mL Intravenous Q12H  . warfarin   Does not apply Once  . Warfarin - Pharmacist Dosing Inpatient   Does not apply q1800   Continuous Infusions:   LOS: 7 days  Kerney Elbe, DO [facility] Pager [phone]  If 7PM-7AM, please contact night-coverage [email or web address] Password [email or web address] 04/04/2016, 7:50 AM

## 2016-04-04 NOTE — Progress Notes (Signed)
[place] for Lovenox, Warfarin Indication: new pulmonary embolus and DVT  No Known Allergies  Patient Measurements: Height: 5\' 5"  (165.1 cm) Weight: 165 lb 5.5 oz (75 kg) IBW/kg (Calculated) : 57  Vital Signs: Temp: 98.1 F (36.7 C) (01/20 0626) Temp Source: Oral (01/20 0626) BP: 109/78 (01/20 0626) Pulse Rate: 66 (01/20 0626)  Labs:  Recent Labs  04/02/16 0510 04/03/16 0445 04/04/16 0511  HGB 13.0  --  12.9  HCT 39.5  --  40.0  PLT 304  --  365  LABPROT 17.9* 19.2* 22.0*  INR 1.46 1.60 1.90  CREATININE 0.91  --   --     Estimated Creatinine Clearance: 38.3 mL/min (by C-G formula based on SCr of 0.91 mg/dL).   Medications:  Scheduled:  . citalopram  20 mg Oral q morning - 10a  . donepezil  10 mg Oral q morning - 10a  . enoxaparin (LOVENOX) injection  70 mg Subcutaneous Q12H  . furosemide  20 mg Oral Once  . memantine  28 mg Oral Daily  . polyethylene glycol  17 g Oral Daily  . senna  2 tablet Oral QHS  . sodium chloride flush  3 mL Intravenous Q12H  . warfarin   Does not apply Once  . Warfarin - Pharmacist Dosing Inpatient   Does not apply q1800    Assessment: 61 yoF admitted on 1/13 with hx of dementia and peri-anal skin cancer now with severe LLE swelling.  She's currently on lovenox and warfarin for new DVT/PE.  - 1/14 Vascular US: positive for LLE DVT - 1/14 CTa: positive for saddle emboli at the bifurcations of the LEFT and RIGHT pulmonary arteries, with thrombus extending into RIGHT upper lobe and LEFT lower lobe.  Not a tPA candidate per PCCM.   Today, 04/04/2016:  INR remains subtherapeutic at 1.90  but trending up towards goal range  CBC: Hgb and Plt stable/WNL  SCr 0.91 with CrCl ~ 38 ml/min  Diet: heart healthy, eating 25-75% of meal tray  drug-drug interactions: none (ceftriaxone d/ced on 1/19)  Goal of Therapy:  INR 2-3 Anti-Xa level 0.6-1 units/ml 4hrs after LMWH dose given Monitor platelets by  anticoagulation protocol: Yes   Plan:   Continue Lovenox 70 mg SQ q12h.  Continue Lovenox/Warfarin overlap until INR > 2 for 24 hours.  Repeat Warfarin 7.5 mg PO x 1 at 1800 (hopefully patient's INR will be therapeutic tomorrow and will likely back down on dose)   Monitor for signs and symptoms of bleeding.  Educated patient's daughter on warfarin on 03/30/16   Dia Sitter, PharmD, BCPS 04/04/2016 9:35 AM

## 2016-04-05 DIAGNOSIS — R319 Hematuria, unspecified: Secondary | ICD-10-CM

## 2016-04-05 LAB — CBC WITH DIFFERENTIAL/PLATELET
Basophils Absolute: 0 10*3/uL (ref 0.0–0.1)
Basophils Relative: 0 %
Eosinophils Absolute: 0.3 10*3/uL (ref 0.0–0.7)
Eosinophils Relative: 5 %
HEMATOCRIT: 41.1 % (ref 36.0–46.0)
HEMOGLOBIN: 13.3 g/dL (ref 12.0–15.0)
LYMPHS ABS: 1.8 10*3/uL (ref 0.7–4.0)
LYMPHS PCT: 25 %
MCH: 31.2 pg (ref 26.0–34.0)
MCHC: 32.4 g/dL (ref 30.0–36.0)
MCV: 96.5 fL (ref 78.0–100.0)
MONOS PCT: 12 %
Monocytes Absolute: 0.8 10*3/uL (ref 0.1–1.0)
NEUTROS ABS: 4.1 10*3/uL (ref 1.7–7.7)
Neutrophils Relative %: 58 %
Platelets: 354 10*3/uL (ref 150–400)
RBC: 4.26 MIL/uL (ref 3.87–5.11)
RDW: 13.1 % (ref 11.5–15.5)
WBC: 7.1 10*3/uL (ref 4.0–10.5)

## 2016-04-05 LAB — PROTIME-INR
INR: 2.42
Prothrombin Time: 26.7 seconds — ABNORMAL HIGH (ref 11.4–15.2)

## 2016-04-05 MED ORDER — WARFARIN SODIUM 4 MG PO TABS: 4.0000 mg | ORAL_TABLET | Freq: Once | ORAL | 0 refills | Status: DC

## 2016-04-05 MED ORDER — HYDROCODONE-ACETAMINOPHEN 5-325 MG PO TABS: 1.0000 | ORAL_TABLET | ORAL | 0 refills | Status: DC | PRN

## 2016-04-05 MED ORDER — SENNA 8.6 MG PO TABS: 2.0000 | ORAL_TABLET | Freq: Every day | ORAL | 0 refills | Status: DC

## 2016-04-05 MED ORDER — FUROSEMIDE 10 MG/ML IJ SOLN
20.0000 mg | Freq: Once | INTRAMUSCULAR | Status: AC
  Administered 2016-04-05: 20 mg via INTRAVENOUS
  Filled 2016-04-05: qty 2

## 2016-04-05 MED ORDER — POLYETHYLENE GLYCOL 3350 17 G PO PACK: 17.0000 g | PACK | Freq: Every day | ORAL | 0 refills | Status: DC

## 2016-04-05 MED ORDER — WARFARIN SODIUM 4 MG PO TABS
4.0000 mg | ORAL_TABLET | Freq: Once | ORAL | Status: DC
  Filled 2016-04-05: qty 1

## 2016-04-05 NOTE — Progress Notes (Signed)
[place] for Lovenox, Warfarin Indication: new pulmonary embolus and DVT  No Known Allergies  Patient Measurements: Height: 5\' 5"  (165.1 cm) Weight: 165 lb 5.5 oz (75 kg) IBW/kg (Calculated) : 57  Vital Signs: Temp: 97.9 F (36.6 C) (01/21 0434) Temp Source: Oral (01/21 0434) BP: 147/60 (01/21 0434) Pulse Rate: 66 (01/21 0434)  Labs:  Recent Labs  04/03/16 0445 04/04/16 0511 04/05/16 0513  HGB  --  12.9 13.3  HCT  --  40.0 41.1  PLT  --  365 354  LABPROT 19.2* 22.0* 26.7*  INR 1.60 1.90 2.42    Estimated Creatinine Clearance: 38.3 mL/min (by C-G formula based on SCr of 0.91 mg/dL).   Medications:  Scheduled:  . citalopram  20 mg Oral q morning - 10a  . donepezil  10 mg Oral q morning - 10a  . enoxaparin (LOVENOX) injection  70 mg Subcutaneous Q12H  . memantine  28 mg Oral Daily  . polyethylene glycol  17 g Oral Daily  . senna  2 tablet Oral QHS  . sodium chloride flush  3 mL Intravenous Q12H  . warfarin   Does not apply Once  . Warfarin - Pharmacist Dosing Inpatient   Does not apply q1800    Assessment: 9 yoF admitted on 1/13 with hx of dementia and peri-anal skin cancer now with severe LLE swelling.  She's currently on lovenox and warfarin for new DVT/PE.  - 1/14 Vascular US: positive for LLE DVT - 1/14 CTa: positive for saddle emboli at the bifurcations of the LEFT and RIGHT pulmonary arteries, with thrombus extending into RIGHT upper lobe and LEFT lower lobe.  Not a tPA candidate per PCCM.   Today, 04/05/2016:  INR is therapeutic today but increased noticeably from 1.90 to 2.42.  CBC: Hgb and Plt stable/WNL  maroon vaginal drainage noted on 1/20, per RN no discharge noted this AM, no bleeding noted  SCr 0.91 with CrCl ~ 38 ml/min  Diet: heart healthy, eating 25-75% of meal tray  Significant drug-drug interactions: none  Goal of Therapy:  INR 2-3 Anti-Xa level 0.6-1 units/ml 4hrs after LMWH dose  given Monitor platelets by anticoagulation protocol: Yes   Plan:   D/t lovenox d/t therapeutic INR (per Dr. Alfredia Ferguson)  Reduce Warfarin to 4 mg PO x 1 at 1800   If to d/c to facility today, recommend continuing with 4 mg and to recheck INR in 1-2 days  Monitor for signs and symptoms of bleeding.  Educated patient's daughter on warfarin on 03/30/16   Dia Sitter, PharmD, BCPS 04/05/2016 9:25 AM

## 2016-04-05 NOTE — Discharge Summary (Signed)
Physician Discharge Summary  Laurie Woods [id number] DOB: 11/16/1919 DOA: 03/28/2016  PCP: Geoffery Lyons, MD  Admit date: 03/28/2016 Discharge date: 04/05/2016  Admitted From: ALF Memory Care Unit Disposition:  ALF Memory Care Unit  Recommendations for Outpatient Follow-up:  1. Follow up with PCP in 1-2 weeks 2. Follow up with Gynecology within 1 week; 3. Follow up with Urology as an outpatient 4. Repeat PT-INR in AM and have Coumadin Adjusted accordingly 5. Please obtain CMP/CBC in AM and repeat within 1 week  Home Health: No Equipment/Devices: None  Discharge Condition: Stable CODE STATUS: DO NOT RESUSCITATE Diet recommendation: Heart Healthy   Brief/Interim Summary: Laurie T McCallumis a 83 y.o.femalewith medical history significant of dementia and Peri-anal skin cancer. Presented with 2 day history ofsevereleft lower extremity swelling patient has significant dementia may unable to provide further history. Patient denied any shortness of breath or chest pain.Denies any pain lower extremity did not endorse any history of fevers or chills she is unsure what's making the swelling worse or better. Patient underwent lower extremity doppler and was found to have extensive DVT of left leg extending to left iliac vein. CTA showing bilateral saddle emboli and signs of right heart strain. Echocardiogram ordered and performed and did not show signs of right heart strain. Patient started on coumadin with lovenox bridge. Will remain inpatient until Coumadin is therapeutic on Lovenox Bridge. INR slowly improving. PT consulted for evaluation and Patient does not require SNF. Yesterday AM patient was noted to have maroon vaginal discharge when ambulating to the restroom. H/H stable. Cannot tell if it is Hematuria vs. Vaginal Discharge. Patient to follow up with Urology and Gynecology at D/C. At this time she was medically stable to be D/C'd back to ALF and follow with the Physician  there and follow up with Gynecology and Urology as an outpatient.   Discharge Diagnoses:  Principal Problem:   Saddle embolus ([place]) Active Problems:   Left leg cellulitis   Leg edema   Suspected UTI   Dementia with behavioral disturbance  Bilateral Saddle Submassive Pulmonary Emboli -CT Angio Chest PE showed Positive exam for pulmonary embolism with saddle emboli at the bifurcations of the LEFT and RIGHT pulmonary arteries, with thrombus extending into RIGHT upper lobe and LEFT lower lobe. -CT Evidence of Heart Strain however ECHO showed Normal LV size with moderate LV hypertrophy. EF 65-70%. Normal RV size and systolic function. No significant valvular abnormalities. -Telemetry was D/C'd 2/2 to Agitation and Family Preference -INR Therapeutic at 2.42 -D/C'd Lovenox Bridge as INR now therapeutic  -Continue Warfarin 4 mg po daily -Monitor INRs and CBC in AM -PT Evaluation revealed no Skilled needs and will be D/C'd to ALF once ready for discharge -Follow up with Physician at ALF  Left Leg Edema with ?Cellulitis -Leg slightly warm and redness improving, Suspected from DVT -Vascular U/S positive for extensive DVT up to iliac vein -Discussed with vascular surgery- no intervention at this time  -Coumadin level now therapeutic -Pain Control with Norco -Monitor INR's daily -Given 1 dose of IV Lasix 20 mg Lasix -Abx D/C'd -Follow up at Physician at ALF and have leg re-evaluated -Repeat CMP as an outpatient  UTI, treated -Urinalysis showed Many Bacteria, Large Leukocytes, TNTC WBC and Negative Nitrite; Per Nurse Urine was very Advertising account planner; Also showed Hematuria as she had had Moderate Hb and TNTC RBC -Unable to determine if symptomatic because of Dementia -Urine Culture Ordered  However patient had already received Abx and showed <10,000 CFU's of  Insignificant Growth however nurse stated Urine very foul smelling -Abx Treatment With IV Ceftriaxone Discontinued as patient completed  Therapy  Maroon Vaginal Discharge/Mild Vaginal Bleeding vs. ? Hematuria -Patient is on Coumadin; Loveox injections now D/C'd -? Bleeding from UTI; Urinalysis showed Had Moderate Hb and TNTC RBC -Asymptomatic and Hb/Hct Stable at 13.3/41.1; ? Vaginal Atrophy and Thinning exacerbated by Anticoagulants vs. Hematuria from recent UTI from Anticoagulant Use  -Repeat CBC in AM and obtain FOBT to r/o GI Bleed -Outpatient follow up with Gyencology and Urology -Continue to Monitor closely for Additional S/Sx of Bleeding   Dementia -C/w Redirection -Continue Donepezil 10 mg po daily and Memantine XR 28 mg po Daily  -Will try to keep room dark when nighttime and avoid nighttime awakenings -Monitor Closely; C/w Antidepressant Citalaopram  Discharge Instructions  Discharge Instructions    Call MD for:  difficulty breathing, headache or visual disturbances    Complete by:  As directed    Call MD for:  extreme fatigue    Complete by:  As directed    Call MD for:  persistant dizziness or light-headedness    Complete by:  As directed    Call MD for:  persistant nausea and vomiting    Complete by:  As directed    Call MD for:  redness, tenderness, or signs of infection (pain, swelling, redness, odor or green/yellow discharge around incision site)    Complete by:  As directed    Call MD for:  severe uncontrolled pain    Complete by:  As directed    Call MD for:  temperature >100.4    Complete by:  As directed    Diet - low sodium heart healthy    Complete by:  As directed    Discharge instructions    Complete by:  As directed    Follow up Care at ALF; Please obtain INR, CBC, CMP at facility. Take all medications as prescribed. If symptoms change or worsen please return to the ED for Evaluation. Follow up with Gynecology as an outpatient.   Increase activity slowly    Complete by:  As directed      Allergies as of 04/05/2016   No Known Allergies     Medication List    TAKE these  medications   citalopram 20 MG tablet Commonly known as:  CELEXA Take 20 mg by mouth every morning.   donepezil 10 MG tablet Commonly known as:  ARICEPT Take 10 mg by mouth every morning.   HYDROcodone-acetaminophen 5-325 MG tablet Commonly known as:  NORCO/VICODIN Take 1-2 tablets by mouth every 4 (four) hours as needed for moderate pain.   NAMENDA XR 28 MG Cp24 24 hr capsule Generic drug:  memantine Take 28 mg by mouth daily.   polyethylene glycol packet Commonly known as:  MIRALAX / GLYCOLAX Take 17 g by mouth daily. Start taking on:  04/06/2016   senna 8.6 MG Tabs tablet Commonly known as:  SENOKOT Take 2 tablets (17.2 mg total) by mouth at bedtime.   warfarin 4 MG tablet Commonly known as:  COUMADIN Take 1 tablet (4 mg total) by mouth one time only at 6 PM.       No Known Allergies  Consultations:  None  Procedures/Studies: Dg Tibia/fibula Left  Result Date: 03/29/2016 CLINICAL DATA:  Left lower extremity edema EXAM: LEFT TIBIA AND FIBULA - 2 VIEW COMPARISON:  None. FINDINGS: There is no evidence of fracture or other focal bone lesions. There are vascular calcifications. Mild-to-moderate  soft tissue edema. IMPRESSION: Mild-to-moderate soft tissue edema without osseous abnormality. Electronically Signed   By: Ulyses Jarred M.D.   On: 03/29/2016 01:04   Ct Angio Chest Pe W Or Wo Contrast  Result Date: 03/29/2016 CLINICAL DATA:  LEFT leg edema, elevated D-dimer, suspected DVT, history dementia, MI, perianal squamous cell carcinoma, former smoker EXAM: CT ANGIOGRAPHY CHEST WITH CONTRAST TECHNIQUE: Multidetector CT imaging of the chest was performed using the standard protocol during bolus administration of intravenous contrast. Multiplanar CT image reconstructions and MIPs were obtained to evaluate the vascular anatomy. Beam hardening artifacts from necklace. CONTRAST:  100 cc Isovue 370 IV COMPARISON:  None FINDINGS: Cardiovascular: Atherosclerotic calcifications aorta  and proximal great vessels. Aorta normal caliber without aneurysm or dissection. Pulmonary arteries adequately opacified. Filling defects identified at the bifurcations of the LEFT and RIGHT pulmonary arteries consistent with saddle pulmonary emboli. Extension of emboli into the LEFT lower lobe and RIGHT upper lobe. RV/LV ratio = 1.14, elevated, consistent with RIGHT heart strain and at least submassive P.E. No pericardial effusion. Mediastinum/Nodes: Small hiatal hernia. Esophagus unremarkable. Base of cervical region partially obscured by artifacts from necklace. No definite thoracic adenopathy. Lungs/Pleura: Lungs clear, scattered respiratory motion artifacts present. Upper Abdomen: Stomach unopacified and under distended, unable to exclude gastric wall thickening. Remaining visualized upper abdomen normal appearance. Musculoskeletal: Osseous demineralization. Review of the MIP images confirms the above findings. IMPRESSION: Positive exam for pulmonary embolism with saddle emboli at the bifurcations of the LEFT and RIGHT pulmonary arteries, with thrombus extending into RIGHT upper lobe and LEFT lower lobe. Positive for acute PE with CT evidence of right heart strain (RV/LV Ratio = 1.14) consistent with at least submassive (intermediate risk) PE. The presence of right heart strain has been associated with an increased risk of morbidity and mortality. Please activate Code PE by paging [phone]. Critical Value/emergent results were called by telephone at the time of interpretation on 03/29/2016 at 1:52 pm to Dr. Ilene Qua , who verbally acknowledged these results. Electronically Signed   By: Lavonia Dana M.D.   On: 03/29/2016 13:53    ECHOCARDIOGRAM Study Conclusions  - Left ventricle: The cavity size was normal. Wall thickness was   increased in a pattern of moderate LVH. Systolic function was   vigorous. The estimated ejection fraction was in the range of 65%   to 70%. Wall motion was normal;  there were no regional wall   motion abnormalities. Doppler parameters are consistent with   abnormal left ventricular relaxation (grade 1 diastolic   dysfunction). - Aortic valve: There was no stenosis. - Mitral valve: There was no significant regurgitation. - Right ventricle: The cavity size was normal. Systolic function   was normal. - Pulmonary arteries: No complete TR doppler jet so unable to   estimate PA systolic pressure. - Inferior vena cava: The vessel was normal in size. The   respirophasic diameter changes were in the normal range (>= 50%),   consistent with normal central venous pressure.  Impressions:  - Normal LV size with moderate LV hypertrophy. EF 65-70%. Normal RV   size and systolic function. No significant valvular   abnormalities.  Subjective: Seen and examined at bedside and was pleasantly confused. No complaints and doing well. Would repeat herself. Discussed with Daughter about D/C and will follow up with physician at ALF.  Discharge Exam: Vitals:   04/04/16 2037 04/05/16 0434  BP: (!) 153/63 (!) 147/60  Pulse: 72 66  Resp: 18 20  Temp: 98.1 F (36.7  C) 97.9 F (36.6 C)   Vitals:   04/04/16 0958 04/04/16 1340 04/04/16 2037 04/05/16 0434  BP: 122/66 123/60 (!) 153/63 (!) 147/60  Pulse: 66 68 72 66  Resp: 18 18 18 20   Temp:  98.3 F (36.8 C) 98.1 F (36.7 C) 97.9 F (36.6 C)  TempSrc:  Oral Oral Oral  SpO2: 95% 95% 98% 95%  Weight:      Height:       General: Pt is pleasantly confused, awake, not in acute distress Cardiovascular: RRR, S1/S2 +, no rubs, no gallops Respiratory: CTA bilaterally, no wheezing, no rhonchi Abdominal: Soft, NT, ND, bowel sounds + Extremities: no edema, no cyanosis Gu: Had some mild blood  The results of significant diagnostics from this hospitalization (including imaging, microbiology, ancillary and laboratory) are listed below for reference.     Microbiology: Recent Results (from the past 240 hour(s))   Culture, blood (routine x 2)     Status: None   Collection Time: 03/28/16 11:34 PM  Result Value Ref Range Status   Specimen Description BLOOD L ARM  Final   Special Requests BOTTLES DRAWN AEROBIC AND ANAEROBIC 5ML EA  Final   Culture   Final    NO GROWTH 5 DAYS Performed at [facility], [address], [place], [place] [address]    Report Status 04/03/2016 FINAL  Final  Culture, blood (routine x 2)     Status: None   Collection Time: 03/28/16 11:35 PM  Result Value Ref Range Status   Specimen Description BLOOD RIGHT HAND  Final   Special Requests BOTTLES DRAWN AEROBIC AND ANAEROBIC 5ML EA  Final   Culture   Final    NO GROWTH 5 DAYS Performed at [facility], [place] [address] [place], [place] [address]    Report Status 04/03/2016 FINAL  Final  MRSA PCR Screening     Status: None   Collection Time: 03/29/16  1:24 AM  Result Value Ref Range Status   MRSA by PCR NEGATIVE NEGATIVE Final    Comment:        The GeneXpert MRSA Assay (FDA approved for NASAL specimens only), is one component of a comprehensive MRSA colonization surveillance program. It is not intended to diagnose MRSA infection nor to guide or monitor treatment for MRSA infections.   Culture, Urine     Status: Abnormal   Collection Time: 03/31/16  9:03 AM  Result Value Ref Range Status   Specimen Description URINE, RANDOM  Final   Special Requests NONE  Final   Culture (A)  Final    <10,000 COLONIES/mL INSIGNIFICANT GROWTH Performed at [facility], [address] [place], [place] [address]    Report Status 04/02/2016 FINAL  Final     Labs: BNP (last 3 results) No results for input(s): BNP in the last 8760 hours. Basic Metabolic Panel:  Recent Labs Lab 04/02/16 0510  NA 139  K 3.9  CL 105  CO2 27  GLUCOSE 96  BUN 20  CREATININE 0.91  CALCIUM 8.2*  MG 2.4  PHOS 3.4   Liver Function Tests:  Recent Labs Lab 04/02/16 0510  AST 23  ALT 20  ALKPHOS 85  BILITOT 0.5   PROT 5.9*  ALBUMIN 2.7*   No results for input(s): LIPASE, AMYLASE in the last 168 hours. No results for input(s): AMMONIA in the last 168 hours. CBC:  Recent Labs Lab 04/01/16 0515 04/02/16 0510 04/04/16 0511 04/05/16 0513  WBC 9.7 9.2  6.8 7.1  NEUTROABS  --  5.9  --  4.1  HGB 12.7 13.0 12.9 13.3  HCT 38.3 39.5 40.0 41.1  MCV 94.8 95.4 95.2 96.5  PLT 303 304 365 354   Cardiac Enzymes: No results for input(s): CKTOTAL, CKMB, CKMBINDEX, TROPONINI in the last 168 hours. BNP: Invalid input(s): POCBNP CBG:  Recent Labs Lab 03/30/16 0721  GLUCAP 93   D-Dimer No results for input(s): DDIMER in the last 72 hours. Hgb A1c No results for input(s): HGBA1C in the last 72 hours. Lipid Profile No results for input(s): CHOL, HDL, LDLCALC, TRIG, CHOLHDL, LDLDIRECT in the last 72 hours. Thyroid function studies No results for input(s): TSH, T4TOTAL, T3FREE, THYROIDAB in the last 72 hours.  Invalid input(s): FREET3 Anemia work up No results for input(s): VITAMINB12, FOLATE, FERRITIN, TIBC, IRON, RETICCTPCT in the last 72 hours. Urinalysis    Component Value Date/Time   COLORURINE AMBER (A) 03/31/2016 0903   APPEARANCEUR CLOUDY (A) 03/31/2016 0903   LABSPEC 1.032 (H) 03/31/2016 0903   PHURINE 5.0 03/31/2016 0903   GLUCOSEU NEGATIVE 03/31/2016 0903   HGBUR MODERATE (A) 03/31/2016 0903   BILIRUBINUR NEGATIVE 03/31/2016 0903   KETONESUR NEGATIVE 03/31/2016 0903   PROTEINUR 30 (A) 03/31/2016 0903   NITRITE NEGATIVE 03/31/2016 0903   LEUKOCYTESUR LARGE (A) 03/31/2016 0903   Sepsis Labs Invalid input(s): PROCALCITONIN,  WBC,  LACTICIDVEN Microbiology Recent Results (from the past 240 hour(s))  Culture, blood (routine x 2)     Status: None   Collection Time: 03/28/16 11:34 PM  Result Value Ref Range Status   Specimen Description BLOOD L ARM  Final   Special Requests BOTTLES DRAWN AEROBIC AND ANAEROBIC 5ML EA  Final   Culture   Final    NO GROWTH 5 DAYS Performed at [facility], [place] [address], [place], [place] [address]    Report Status 04/03/2016 FINAL  Final  Culture, blood (routine x 2)     Status: None   Collection Time: 03/28/16 11:35 PM  Result Value Ref Range Status   Specimen Description BLOOD RIGHT HAND  Final   Special Requests BOTTLES DRAWN AEROBIC AND ANAEROBIC 5ML EA  Final   Culture   Final    NO GROWTH 5 DAYS Performed at [facility], [place] [address], [place], [place] [address]    Report Status 04/03/2016 FINAL  Final  MRSA PCR Screening     Status: None   Collection Time: 03/29/16  1:24 AM  Result Value Ref Range Status   MRSA by PCR NEGATIVE NEGATIVE Final    Comment:        The GeneXpert MRSA Assay (FDA approved for NASAL specimens only), is one component of a comprehensive MRSA colonization surveillance program. It is not intended to diagnose MRSA infection nor to guide or monitor treatment for MRSA infections.   Culture, Urine     Status: Abnormal   Collection Time: 03/31/16  9:03 AM  Result Value Ref Range Status   Specimen Description URINE, RANDOM  Final   Special Requests NONE  Final   Culture (A)  Final    <10,000 COLONIES/mL INSIGNIFICANT GROWTH Performed at [facility], [address] [place], [place] [address]    Report Status 04/02/2016 FINAL  Final   Time coordinating discharge: Over 30 minutes  SIGNED:  Kerney Elbe, DO [facility] 04/05/2016, 12:47 PM Pager [phone]  If 7PM-7AM, please contact night-coverage [email or web address] Password [email or web address]

## 2016-04-05 NOTE — NC FL2 (Signed)
[place] MEDICAID FL2 LEVEL OF CARE SCREENING TOOL     IDENTIFICATION  Patient Name: Laurie Woods Birthdate: [birth year] Sex: female Admission Date (Current Location): 03/28/2016  [facility] and [place] Number:  Herbalist and Address:  [facility],  [address], [place]      Provider Number: [id number]  Attending Physician Name and Address:  Kerney Elbe, DO  Relative Name and Phone Number:       Current Level of Care: Hospital Recommended Level of Care: Memory Care ([place]) Prior Approval Number:    Date Approved/Denied:   PASRR Number:    Discharge Plan: Other (Comment) (Memory care facility)    Current Diagnoses: Patient Active Problem List   Diagnosis Date Noted  . Saddle embolus ([place]) 04/01/2016  . Suspected UTI 04/01/2016  . Dementia with behavioral disturbance 04/01/2016  . Left leg cellulitis 03/28/2016  . Leg edema 03/28/2016  . Open wound 07/23/2011  . Postop check 07/15/2011  . Squamous cell cancer of skin of buttock 05/26/2011  . Squamous cell cancer, peri-anal 04/30/2011    Orientation RESPIRATION BLADDER Height & Weight     Self  Normal Continent Weight: 165 lb 5.5 oz (75 kg) Height:  5\' 5"  (165.1 cm)  BEHAVIORAL SYMPTOMS/MOOD NEUROLOGICAL BOWEL NUTRITION STATUS      Continent Diet (Regular diet)  AMBULATORY STATUS COMMUNICATION OF NEEDS Skin   Limited Assist Verbally Normal                       Personal Care Assistance Level of Assistance  Bathing, Feeding, Dressing Bathing Assistance: Limited assistance Feeding assistance: Independent Dressing Assistance: Limited assistance     Functional Limitations Info             SPECIAL CARE FACTORS FREQUENCY                       Contractures      Additional Factors Info  Code Status, Allergies Code Status Info: DNR Allergies Info: NKA           Current Medications (04/05/2016):  This is the current hospital active  medication list Current Facility-Administered Medications  Medication Dose Route Frequency Provider Last Rate Last Dose  . acetaminophen (TYLENOL) tablet 650 mg  650 mg Oral Q6H PRN Toy Baker, MD   650 mg at 04/04/16 2208   Or  . acetaminophen (TYLENOL) suppository 650 mg  650 mg Rectal Q6H PRN Toy Baker, MD      . citalopram (CELEXA) tablet 20 mg  20 mg Oral q morning - 10a Toy Baker, MD   20 mg at 04/05/16 1016  . donepezil (ARICEPT) tablet 10 mg  10 mg Oral q morning - 10a Toy Baker, MD   10 mg at 04/05/16 1016  . HYDROcodone-acetaminophen (NORCO/VICODIN) 5-325 MG per tablet 1-2 tablet  1-2 tablet Oral Q4H PRN Toy Baker, MD      . memantine (NAMENDA XR) 24 hr capsule 28 mg  28 mg Oral Daily Toy Baker, MD   28 mg at 04/05/16 1016  . ondansetron (ZOFRAN) tablet 4 mg  4 mg Oral Q6H PRN Toy Baker, MD       Or  . ondansetron (ZOFRAN) injection 4 mg  4 mg Intravenous Q6H PRN Toy Baker, MD      . polyethylene glycol (MIRALAX / GLYCOLAX) packet 17 g  17 g Oral Daily Eber Jones, MD   [phone]  g at 04/05/16 1016  . senna (SENOKOT) tablet 17.2 mg  2 tablet Oral QHS Eber Jones, MD   17.2 mg at 04/04/16 2208  . sodium chloride flush (NS) 0.9 % injection 3 mL  3 mL Intravenous Q12H Toy Baker, MD   3 mL at 04/05/16 1017  . warfarin (COUMADIN) tablet 4 mg  4 mg Oral ONCE-1800 Anh P Pham, RPH      . warfarin (COUMADIN) video   Does not apply [employer], RPH      . Warfarin - Pharmacist Dosing Inpatient   Does not apply [place]         Discharge Medications: Please see discharge summary for a list of discharge medications.  Relevant Imaging Results:  Relevant Lab Results:   Additional Information SS#:[id number]  Weston Anna, LCSW

## 2016-04-05 NOTE — Progress Notes (Signed)
Patient's daughter to transport patient to ALF, daughter will deliver discharge packet, DNR, and prescription to facility, IV removed.

## 2016-04-05 NOTE — Progress Notes (Signed)
CSW received call from [facility] requesting FL2 for patient to discharge. CSW completed FL2 and faxed information to facility.   Kingsley Spittle, [place] Clinical Social Worker [phone]

## 2016-04-05 NOTE — Progress Notes (Signed)
Patient is set to discharge to [facility] today. Patient & family aware. Discharge packet given to RN. Daughter will transfer patient.   Kingsley Spittle, [place] Clinical Social Worker [phone]

## 2016-09-08 ENCOUNTER — Emergency Department (HOSPITAL_COMMUNITY): Payer: Medicare Other

## 2016-09-08 ENCOUNTER — Encounter (HOSPITAL_COMMUNITY): Payer: Self-pay | Admitting: Emergency Medicine

## 2016-09-08 ENCOUNTER — Observation Stay (HOSPITAL_COMMUNITY)
Admission: EM | Admit: 2016-09-08 | Discharge: 2016-09-09 | Disposition: A | Discharge status: Hospice | Payer: Medicare Other | Attending: Internal Medicine | Admitting: Internal Medicine

## 2016-09-08 DIAGNOSIS — R1110 Vomiting, unspecified: Secondary | ICD-10-CM | POA: Insufficient documentation

## 2016-09-08 DIAGNOSIS — I6201 Nontraumatic acute subdural hemorrhage: Secondary | ICD-10-CM | POA: Insufficient documentation

## 2016-09-08 DIAGNOSIS — Z79899 Other long term (current) drug therapy: Secondary | ICD-10-CM | POA: Insufficient documentation

## 2016-09-08 DIAGNOSIS — I252 Old myocardial infarction: Secondary | ICD-10-CM | POA: Insufficient documentation

## 2016-09-08 DIAGNOSIS — Z85828 Personal history of other malignant neoplasm of skin: Secondary | ICD-10-CM | POA: Insufficient documentation

## 2016-09-08 DIAGNOSIS — Z7901 Long term (current) use of anticoagulants: Secondary | ICD-10-CM | POA: Diagnosis not present

## 2016-09-08 DIAGNOSIS — F03918 Unspecified dementia, unspecified severity, with other behavioral disturbance: Secondary | ICD-10-CM | POA: Diagnosis present

## 2016-09-08 DIAGNOSIS — F0391 Unspecified dementia with behavioral disturbance: Secondary | ICD-10-CM | POA: Insufficient documentation

## 2016-09-08 DIAGNOSIS — I612 Nontraumatic intracerebral hemorrhage in hemisphere, unspecified: Secondary | ICD-10-CM

## 2016-09-08 DIAGNOSIS — Z7189 Other specified counseling: Secondary | ICD-10-CM

## 2016-09-08 DIAGNOSIS — Z86711 Personal history of pulmonary embolism: Secondary | ICD-10-CM | POA: Insufficient documentation

## 2016-09-08 DIAGNOSIS — I611 Nontraumatic intracerebral hemorrhage in hemisphere, cortical: Principal | ICD-10-CM | POA: Insufficient documentation

## 2016-09-08 DIAGNOSIS — E875 Hyperkalemia: Secondary | ICD-10-CM

## 2016-09-08 DIAGNOSIS — F419 Anxiety disorder, unspecified: Secondary | ICD-10-CM | POA: Insufficient documentation

## 2016-09-08 DIAGNOSIS — I619 Nontraumatic intracerebral hemorrhage, unspecified: Secondary | ICD-10-CM | POA: Diagnosis present

## 2016-09-08 DIAGNOSIS — I10 Essential (primary) hypertension: Secondary | ICD-10-CM | POA: Diagnosis not present

## 2016-09-08 DIAGNOSIS — Z66 Do not resuscitate: Secondary | ICD-10-CM | POA: Insufficient documentation

## 2016-09-08 DIAGNOSIS — Z515 Encounter for palliative care: Secondary | ICD-10-CM | POA: Insufficient documentation

## 2016-09-08 DIAGNOSIS — Z87891 Personal history of nicotine dependence: Secondary | ICD-10-CM | POA: Insufficient documentation

## 2016-09-08 LAB — I-STAT TROPONIN, ED: Troponin i, poc: 0.01 ng/mL (ref 0.00–0.08)

## 2016-09-08 LAB — COMPREHENSIVE METABOLIC PANEL
ALK PHOS: 116 U/L (ref 38–126)
AST: 47 U/L — ABNORMAL HIGH (ref 15–41)
Albumin: 4.1 g/dL (ref 3.5–5.0)
Anion gap: 12 (ref 5–15)
BILIRUBIN TOTAL: 1.9 mg/dL — AB (ref 0.3–1.2)
BUN: 10 mg/dL (ref 6–20)
CALCIUM: 8.8 mg/dL — AB (ref 8.9–10.3)
CO2: 21 mmol/L — AB (ref 22–32)
CREATININE: 1.09 mg/dL — AB (ref 0.44–1.00)
Chloride: 102 mmol/L (ref 101–111)
GFR calc non Af Amer: 42 mL/min — ABNORMAL LOW (ref >60)
GFR, EST AFRICAN AMERICAN: 49 mL/min — AB (ref >60)
GLUCOSE: 199 mg/dL — AB (ref 65–99)
Potassium: 5.9 mmol/L — ABNORMAL HIGH (ref 3.5–5.1)
SODIUM: 135 mmol/L (ref 135–145)
TOTAL PROTEIN: 6.9 g/dL (ref 6.5–8.1)

## 2016-09-08 LAB — CBC
HEMATOCRIT: 42.9 % (ref 36.0–46.0)
HEMOGLOBIN: 13.9 g/dL (ref 12.0–15.0)
MCH: 29.1 pg (ref 26.0–34.0)
MCHC: 32.4 g/dL (ref 30.0–36.0)
MCV: 89.9 fL (ref 78.0–100.0)
PLATELETS: 358 10*3/uL (ref 150–400)
RBC: 4.77 MIL/uL (ref 3.87–5.11)
RDW: 13.9 % (ref 11.5–15.5)
WBC: 15.4 10*3/uL — ABNORMAL HIGH (ref 4.0–10.5)

## 2016-09-08 LAB — DIFFERENTIAL
Basophils Absolute: 0 10*3/uL (ref 0.0–0.1)
Basophils Relative: 0 %
Eosinophils Absolute: 0.1 10*3/uL (ref 0.0–0.7)
Eosinophils Relative: 0 %
LYMPHS ABS: 1.2 10*3/uL (ref 0.7–4.0)
LYMPHS PCT: 8 %
MONO ABS: 0.9 10*3/uL (ref 0.1–1.0)
Monocytes Relative: 6 %
NEUTROS ABS: 13.2 10*3/uL — AB (ref 1.7–7.7)
Neutrophils Relative %: 86 %

## 2016-09-08 LAB — PROTIME-INR
INR: 2.52
PROTHROMBIN TIME: 27.6 s — AB (ref 11.4–15.2)

## 2016-09-08 LAB — LIPASE, BLOOD: LIPASE: 18 U/L (ref 11–51)

## 2016-09-08 LAB — APTT: aPTT: 36 seconds (ref 24–36)

## 2016-09-08 MED ORDER — MORPHINE SULFATE (PF) 4 MG/ML IV SOLN: 1.0000 mg | INTRAVENOUS | Status: DC | PRN

## 2016-09-08 MED ORDER — ONDANSETRON HCL 4 MG/2ML IJ SOLN
4.0000 mg | Freq: Once | INTRAMUSCULAR | Status: AC
  Administered 2016-09-08: 4 mg via INTRAVENOUS
  Filled 2016-09-08: qty 2

## 2016-09-08 MED ORDER — ONDANSETRON HCL 4 MG PO TABS: 4.0000 mg | ORAL_TABLET | Freq: Four times a day (QID) | ORAL | Status: DC | PRN

## 2016-09-08 MED ORDER — LORAZEPAM 2 MG/ML IJ SOLN
1.0000 mg | INTRAMUSCULAR | Status: DC | PRN
  Administered 2016-09-09: 1 mg via INTRAVENOUS
  Filled 2016-09-08: qty 1

## 2016-09-08 MED ORDER — LORAZEPAM 1 MG PO TABS: 1.0000 mg | ORAL_TABLET | ORAL | Status: DC | PRN

## 2016-09-08 MED ORDER — VITAMIN K1 10 MG/ML IJ SOLN
5.0000 mg | Freq: Once | INTRAMUSCULAR | Status: AC
  Administered 2016-09-09: 5 mg via SUBCUTANEOUS
  Filled 2016-09-08: qty 0.5

## 2016-09-08 MED ORDER — LORAZEPAM 2 MG/ML IJ SOLN
1.0000 mg | Freq: Once | INTRAMUSCULAR | Status: AC
  Administered 2016-09-08: 1 mg via INTRAVENOUS
  Filled 2016-09-08: qty 1

## 2016-09-08 MED ORDER — MORPHINE SULFATE (PF) 4 MG/ML IV SOLN
4.0000 mg | Freq: Once | INTRAVENOUS | Status: AC
  Administered 2016-09-08: 4 mg via INTRAVENOUS
  Filled 2016-09-08: qty 1

## 2016-09-08 MED ORDER — ONDANSETRON HCL 4 MG/2ML IJ SOLN: 4.0000 mg | Freq: Four times a day (QID) | INTRAMUSCULAR | Status: DC | PRN

## 2016-09-08 MED ORDER — LORAZEPAM 2 MG/ML PO CONC: 1.0000 mg | ORAL | Status: DC | PRN

## 2016-09-08 NOTE — ED Triage Notes (Signed)
Per EMS, patient from [place], staff reports patient has been vomiting with AMS since 5pm today. Patient has pinpoint pupils and per staff received ativan today for the first time. Hx dementia. Alert to self. No vomiting with EMS.  20g L Wrist  BP 225/100 HR 88 RR 20 CBG 185

## 2016-09-08 NOTE — ED Notes (Signed)
Family at bedside. 

## 2016-09-08 NOTE — H&P (Signed)
History and Physical    Laurie Woods EXH:[id number] DOB: Jul 24, 1919 DOA: 09/08/2016  PCP: Burnard Bunting, MD   Patient coming from: ALF via EMS  Chief Complaint: Altered mental status, nausea, vomiting  HPI: Laurie Woods is a 83 y.o. 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  History provided by the patient's daughter because the patient is obtunded.  She has remained in a memory care unit at [facility], but she has needed an increased level of care recently.  The patient has had increased agitation for several days, as well as decreased appetite.  She has not verbalized specific complaints of pain.  No known head trauma though she has had at least one fall since being on warfarin (she was not brought to medical attention because there was no apparent injury at that time).    Today, she developed nausea and vomiting and became acutely diaphoretic.  No documented fever.  Again, no specific complaints of pain.  It was noted that she was not moving her left arm or leg.  911 was called and the patient was brought to the ED by EMS.  ED Course: She was found to have markedly elevated blood pressures (225/100 reported by EMS, 247/133 charted in the ED).  Unfortunately, head CT shows 5 x 9 x 8 cm intraparenchymal hemorrhage with a 67mm right SDH.  There is 1.5cm midline shift.  ED attending spoke to neurology regarding findings.  Poor prognosis.  Family understands and is agreeable to comfort measures only.  Hospitalist asked to admit.  Review of Systems: Unable to obtain due to altered mental status.   Past Medical History:  Diagnosis Date  . Anxiety   . Dementia   . Myocardial infarction [facility])    per old MI in [place].  Pt and family are unaware.  . Varicose veins     Past Surgical History:  Procedure Laterality Date  .  LESION DESTRUCTION  07/06/2011   Procedure: DESTRUCTION LESION ANUS;  Surgeon: Gwenyth Ober, MD;  Location: [place];  Service: General;  Laterality: N/A;  Excision of peri-anal cancer  . 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 VARICOSE VEIN SURGERY       reports that she quit smoking about 53 years ago. She quit after 10.00 years of use. She has never used smokeless tobacco. She reports that she does not drink alcohol or use drugs.  No Known Allergies  Family History  Problem Relation Age of Onset  . Heart disease Father   . Pancreatic cancer Sister   . Anesthesia problems Neg Hx      Prior to Admission medications   Medication Sig Start Date End Date Taking? Authorizing Provider  busPIRone (BUSPAR) 7.5 MG tablet Take 7.5 mg by mouth 3 (three) times daily.   Yes [provider]  LORazepam (ATIVAN) 0.5 MG tablet Take 0.5 mg by mouth 2 (two) times daily as needed for anxiety.   Yes [provider]  sertraline (ZOLOFT) 50 MG tablet Take 50 mg by mouth daily.   Yes [provider]  Skin Protectants, Misc. (MINERIN) CREA Apply 1 application topically daily.   Yes [provider]  warfarin (COUMADIN) 1 MG tablet Take 0.5 mg by mouth every evening. Take 0.5 tablet (0.5 mg) every evening with a Warfarin 4 mg tablet=4.5 mg   Yes [provider]  warfarin (COUMADIN) 4 MG tablet Take 1 tablet (4 mg total) by mouth one time only at 6 PM. Patient taking differently: Take 4 mg by mouth one time only at 6 PM. Take 1 tablet (4 mg) at bedtime with a Warfarin 0.5 mg tablet=4.5 mg 04/05/16  Yes Raiford Noble [place]    Physical Exam: Vitals:   09/08/16 2047 09/08/16 2100 09/08/16 2130 09/08/16 2200  BP: (!) 254/102 (!) 247/133 (!) 172/92 (!) 152/76  Pulse: 100 95 83 88  Resp: (!) 24 (!) 26 (!) 22 (!) 21  Temp:      TempSrc:      SpO2: 92% 92% 96% 94%      Constitutional: NAD, calm, comfortable Vitals:   09/08/16 2047 09/08/16 2100 09/08/16 2130  09/08/16 2200  BP: (!) 254/102 (!) 247/133 (!) 172/92 (!) 152/76  Pulse: 100 95 83 88  Resp: (!) 24 (!) 26 (!) 22 (!) 21  Temp:      TempSrc:      SpO2: 92% 92% 96% 94%   Eyes: pupils are asymmetric, right larger than left, both are sluggish, lids and conjunctivae normal ENMT: Mucous membranes are dry. Posterior oropharynx not visualized. Neck: normal appearance, supple, no masses Respiratory: Coarse bilaterally listening anteriorly.  Normal respiratory effort. No accessory muscle use.  Cardiovascular: Normal rate, regular rhythm, no murmurs / rubs / gallops. Trace pretibial edema in the left leg.  2+ pedal pulses. GI: abdomen is soft and compressible.  No distention.  No tenderness.  Hypoactive bowel sounds. Musculoskeletal:  No joint deformity in upper and lower extremities.  No contractures.  No spontaneous movement. Skin: no rashes, warm and dry Neurologic: Unable to obtain due to mental status changes.   Psychiatric: She is obtunded.    Labs on Admission: I have personally reviewed following labs and imaging studies  CBC:  Recent Labs Lab 09/08/16 1954  WBC 15.4*  NEUTROABS 13.2*  HGB 13.9  HCT 42.9  MCV 89.9  PLT 825   Basic Metabolic Panel:  Recent Labs Lab 09/08/16 1954  NA 135  K 5.9*  CL 102  CO2 21*  GLUCOSE 199*  BUN 10  CREATININE 1.09*  CALCIUM 8.8*   GFR: CrCl cannot be calculated (Unknown ideal weight.).  Liver Function Tests:  Recent Labs Lab 09/08/16 1954  AST 47*  ALT <5*  ALKPHOS 116  BILITOT 1.9*  PROT 6.9  ALBUMIN 4.1    Recent Labs Lab 09/08/16 1954  LIPASE 18   Coagulation Profile:  Recent Labs Lab 09/08/16 1954  INR 2.52   Radiological Exams on Admission: Dg Abdomen 1 View  Result Date: 09/08/2016 CLINICAL DATA:  Vomiting and abdominal pain. EXAM: ABDOMEN - 1 VIEW COMPARISON:  None. FINDINGS: The bowel gas pattern is normal. No radio-opaque calculi or other significant radiographic abnormality are seen.  IMPRESSION: Negative. Electronically Signed   By: Margarette Canada M.D.   On: 09/08/2016 21:02   Ct Head Wo Contrast  Result Date: 09/08/2016 CLINICAL DATA:  83 year old female with altered mental status and confusion. EXAM: CT HEAD WITHOUT CONTRAST TECHNIQUE: Contiguous axial images were obtained from the base of the skull through the vertex without intravenous contrast. COMPARISON:  None. FINDINGS: Brain: A 5 x 9 x 8 cm intraparenchymal hematoma within the right frontal/temporal/parietal region is noted with moderate amount of adjacent edema and 1.5 cm right to left midline shift. A 3 mm right frontoparietal subdural hematoma is also noted. Left lateral ventricular enlargement and effacement of  the right basilar cisterns noted. Probable chronic small-vessel white matter ischemic changes are present. Vascular: Intracranial atherosclerotic calcifications identified. Skull: Normal. Negative for fracture or focal lesion. Sinuses/Orbits: No acute finding. Other: None. IMPRESSION: 5 x 9 x 8 cm right intraparenchymal hematoma and 3 mm right subdural hematoma causing 1.5 cm right to left midline shift, left lateral ventricle enlargement and effacement of the right basilar cisterns. Critical Value/emergent results were called by telephone at the time of interpretation on 09/08/2016 at 9:33 pm to Dr. Dorie Rank , who verbally acknowledged these results. Electronically Signed   By: Margarette Canada M.D.   On: 09/08/2016 21:34   Dg Chest Portable 1 View  Result Date: 09/08/2016 CLINICAL DATA:  Altered mental status with vomiting and fever. EXAM: PORTABLE CHEST 1 VIEW COMPARISON:  07/06/2011 FINDINGS: Cardiomegaly noted. There is no evidence of focal airspace disease, pulmonary edema, suspicious pulmonary nodule/mass, pleural effusion, or pneumothorax. No acute bony abnormalities are identified. IMPRESSION: Cardiomegaly without evidence of acute cardiopulmonary disease. Electronically Signed   By: Margarette Canada M.D.   On: 09/08/2016  21:02   Assessment/Plan Principal Problem:   Intracerebral hemorrhage (HCC) Active Problems:   Dementia with behavioral disturbance   Accelerated hypertension   Hyperkalemia   History of pulmonary embolus (PE)   Warfarin anticoagulation      Acute intracerebral bleeding with midline shift in a patient with a therapeutic INR.  Poor prognosis.  Patient's daughter has confirmed that goals of care are comfort only at this point. --Admit to a med surg bed --Vital signs per unit standard --IV morphine as needed for pain or dyspnea --IV ativan as needed for agitation, restlessness, anxiety, distress --Consider palliative care consult in the AM; consider residential hospice if the patient survives 24 hours. --NPO status --No warfarin, consider one time dose of vitamin K  Accelerated hypertension --Secondary to intracranial hemorrhage --We will not treat.   DVT prophylaxis: None.  Comfort measures only. Code Status: DNR Family Communication: One daughter present in the ED at bedside. Disposition Plan: Residential hospice if needed. Consults called: Neurology called in the ED but formal consult not performed.  Consider palliative care consult in the AM. Admission status: Place in observation; med surg.   TIME SPENT: 50 minutes   Eber Jones MD [facility] Pager [phone]  If 7PM-7AM, please contact night-coverage [email or web address] Password [email or web address]  09/08/2016, 10:16 PM

## 2016-09-08 NOTE — ED Notes (Signed)
Patient transported to CT 

## 2016-09-08 NOTE — ED Provider Notes (Signed)
[place] DEPT Provider Note   CSN: [id number] Arrival date & time: 09/08/16  1931     History   Chief Complaint Chief Complaint  Patient presents with  . Emesis  . Altered Mental Status    HPI Laurie Woods is a 83 y.o. female.  HPI Patient presents to the emergency room for altered mental status and vomiting. The patient is a resident of a nursing care facility.  According to the EMS report patient had a change in her mental status and began vomiting. Staff noticed this at about 5 PM today. Patient has history of dementia. She did receive a new medication, Ativan for the first time today. Patient is confused and is unable to provide any history. She does answer some questions however and tries to follow commands. Past Medical History:  Diagnosis Date  . Anxiety   . Dementia   . Myocardial infarction [facility])    per old MI in [place].  Pt and family are unaware.  . Varicose veins     Patient Active Problem List   Diagnosis Date Noted  . Saddle embolus 04/01/2016  . Suspected UTI 04/01/2016  . Dementia with behavioral disturbance 04/01/2016  . Left leg cellulitis 03/28/2016  . Leg edema 03/28/2016  . Open wound 07/23/2011  . Postop check 07/15/2011  . Squamous cell cancer of skin of buttock 05/26/2011  . 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 LESION DESTRUCTION  07/06/2011   Procedure: DESTRUCTION LESION ANUS;  Surgeon: Gwenyth Ober, MD;  Location: [place];  Service: General;  Laterality: N/A;  Excision of peri-anal cancer  . 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 VARICOSE VEIN SURGERY      OB History    No data available       Home Medications    Prior to Admission medications   Medication Sig Start Date End Date Taking? Authorizing Provider  busPIRone (BUSPAR) 7.5 MG tablet Take 7.5 mg by mouth 3 (three) times daily.   Yes [provider]  LORazepam (ATIVAN) 0.5 MG tablet Take 0.5  mg by mouth 2 (two) times daily as needed for anxiety.   Yes [provider]  sertraline (ZOLOFT) 50 MG tablet Take 50 mg by mouth daily.   Yes [provider]  Skin Protectants, Misc. (MINERIN) CREA Apply 1 application topically daily.   Yes [provider]  warfarin (COUMADIN) 1 MG tablet Take 0.5 mg by mouth every evening. Take 0.5 tablet (0.5 mg) every evening with a Warfarin 4 mg tablet=4.5 mg   Yes [provider]  warfarin (COUMADIN) 4 MG tablet Take 1 tablet (4 mg total) by mouth one time only at 6 PM. Patient taking differently: Take 4 mg by mouth one time only at 6 PM. Take 1 tablet (4 mg) at bedtime with a Warfarin 0.5 mg tablet=4.5 mg 04/05/16  Yes Sheikh, Omair Latif, DO  HYDROcodone-acetaminophen (NORCO/VICODIN) 5-325 MG tablet Take 1-2 tablets by mouth every 4 (four) hours as needed for moderate pain. Patient not taking: Reported on 09/08/2016 04/05/16   Raiford Noble Latif, DO  polyethylene glycol [facility] / GLYCOLAX) packet Take 17 g by mouth daily. Patient not taking: Reported on 09/08/2016 04/06/16   Raiford Noble Latif, DO  senna (SENOKOT) 8.6 MG TABS tablet Take 2 tablets (17.2 mg total) by mouth at bedtime. Patient not taking: Reported on 09/08/2016 04/05/16   Kerney Elbe, DO    Family  History Family History  Problem Relation Age of Onset  . Heart disease Father   . Pancreatic cancer Sister   . Anesthesia problems Neg Hx     Social History Social History  Substance Use Topics  . Smoking status: Former Smoker    Years: 10.00    Quit date: 03/17/1963  . Smokeless tobacco: Never Used  . Alcohol use No     Comment: occasional     Allergies   Patient has no known allergies.   Review of Systems Review of Systems  Unable to perform ROS: Mental status change     Physical Exam Updated Vital Signs BP (!) 172/92   Pulse 83   Temp 98.3 F (36.8 C)   Resp (!) 22   SpO2 96%   Physical Exam  Constitutional: She appears  lethargic.  HENT:  Head: Normocephalic and atraumatic.  Right Ear: External ear normal.  Left Ear: External ear normal.  Mouth/Throat: No oropharyngeal exudate.  Dried emesis around the mouth   Eyes: Conjunctivae are normal. Right eye exhibits no discharge. Left eye exhibits no discharge. No scleral icterus.  No icterus, pupils are small bilaterally  Neck: Neck supple. No tracheal deviation present.  Cardiovascular: Normal rate, regular rhythm and intact distal pulses.   Pulmonary/Chest: Effort normal and breath sounds normal. No stridor. No respiratory distress. She has no wheezes. She has no rales.  Abdominal: Soft. Bowel sounds are normal. She exhibits no distension. There is no tenderness. There is no rebound and no guarding.  Musculoskeletal: She exhibits edema (edema bilateral lower extremity). She exhibits no tenderness.  Neurological: She appears lethargic. She displays tremor. No cranial nerve deficit (no facial droop,  speech is garbles, dysarhric, confused ) or sensory deficit. She exhibits normal muscle tone. She displays no seizure activity. GCS eye subscore is 3. GCS verbal subscore is 4. GCS motor subscore is 6.  Pt will raise the right arm but when asked to raise the left arm she raises the right, does not hold her legs up against gravity, tremor of the right upper extremity,   Skin: Skin is warm. No rash noted. She is not diaphoretic.  Psychiatric: She has a normal mood and affect.  Nursing note and vitals reviewed.    ED Treatments / Results  Labs (all labs ordered are listed, but only abnormal results are displayed) Labs Reviewed  PROTIME-INR - Abnormal; Notable for the following:       Result Value   Prothrombin Time 27.6 (*)    All other components within normal limits  CBC - Abnormal; Notable for the following:    WBC 15.4 (*)    All other components within normal limits  DIFFERENTIAL - Abnormal; Notable for the following:    Neutro Abs 13.2 (*)    All other  components within normal limits  COMPREHENSIVE METABOLIC PANEL - Abnormal; Notable for the following:    Potassium 5.9 (*)    CO2 21 (*)    Glucose, Bld 199 (*)    Creatinine, Ser 1.09 (*)    Calcium 8.8 (*)    AST 47 (*)    ALT <5 (*)    Total Bilirubin 1.9 (*)    GFR calc non Af Amer 42 (*)    GFR calc Af Amer 49 (*)    All other components within normal limits  APTT  LIPASE, BLOOD  ETHANOL  RAPID URINE DRUG SCREEN, HOSP PERFORMED  URINALYSIS, ROUTINE W REFLEX MICROSCOPIC  AMMONIA  I-STAT TROPOININ,  ED      Radiology Dg Abdomen 1 View  Result Date: 09/08/2016 CLINICAL DATA:  Vomiting and abdominal pain. EXAM: ABDOMEN - 1 VIEW COMPARISON:  None. FINDINGS: The bowel gas pattern is normal. No radio-opaque calculi or other significant radiographic abnormality are seen. IMPRESSION: Negative. Electronically Signed   By: Margarette Canada M.D.   On: 09/08/2016 21:02   Ct Head Wo Contrast  Result Date: 09/08/2016 CLINICAL DATA:  83 year old female with altered mental status and confusion. EXAM: CT HEAD WITHOUT CONTRAST TECHNIQUE: Contiguous axial images were obtained from the base of the skull through the vertex without intravenous contrast. COMPARISON:  None. FINDINGS: Brain: A 5 x 9 x 8 cm intraparenchymal hematoma within the right frontal/temporal/parietal region is noted with moderate amount of adjacent edema and 1.5 cm right to left midline shift. A 3 mm right frontoparietal subdural hematoma is also noted. Left lateral ventricular enlargement and effacement of the right basilar cisterns noted. Probable chronic small-vessel white matter ischemic changes are present. Vascular: Intracranial atherosclerotic calcifications identified. Skull: Normal. Negative for fracture or focal lesion. Sinuses/Orbits: No acute finding. Other: None. IMPRESSION: 5 x 9 x 8 cm right intraparenchymal hematoma and 3 mm right subdural hematoma causing 1.5 cm right to left midline shift, left lateral ventricle  enlargement and effacement of the right basilar cisterns. Critical Value/emergent results were called by telephone at the time of interpretation on 09/08/2016 at 9:33 pm to Dr. Dorie Rank , who verbally acknowledged these results. Electronically Signed   By: Margarette Canada M.D.   On: 09/08/2016 21:34   Dg Chest Portable 1 View  Result Date: 09/08/2016 CLINICAL DATA:  Altered mental status with vomiting and fever. EXAM: PORTABLE CHEST 1 VIEW COMPARISON:  07/06/2011 FINDINGS: Cardiomegaly noted. There is no evidence of focal airspace disease, pulmonary edema, suspicious pulmonary nodule/mass, pleural effusion, or pneumothorax. No acute bony abnormalities are identified. IMPRESSION: Cardiomegaly without evidence of acute cardiopulmonary disease. Electronically Signed   By: Margarette Canada M.D.   On: 09/08/2016 21:02    Procedures Procedures (including critical care time)  Medications Ordered in ED Medications  ondansetron [facility] injection 4 mg (4 mg Intravenous Given 09/08/16 2007)  LORazepam (ATIVAN) injection 1 mg (1 mg Intravenous Given 09/08/16 2109)  morphine 4 MG/ML injection 4 mg (4 mg Intravenous Given 09/08/16 2109)     Initial Impression / Assessment and Plan / ED Course  I have reviewed the triage vital signs and the nursing notes.  Pertinent labs & imaging results that were available during my care of the patient were reviewed by me and considered in my medical decision making (see chart for details).  Clinical Course as of Sep 08 2148  Tue Sep 08, 2016  2050 I reviewed the preliminary images of the CT scan. Patient has a large right-sided intracerebral hemorrhage.  This appears to be causing midline shift and complete effacement of the ventricles.  I discussed these findings with the patient's daughter.  I explained to her that unfortunately this type of illness generally fatal.  Patient states her mother has been declining recently. Her dementia is severe enough that she's not recognizing  her daughter is any longer.  She is DO NOT RESUSCITATE and DO NOT INTUBATE.  She agrees that comfort care may be best treatment at this point.  [JK]  2052 INR is elevated as well as her BP.  Considering her degree of hemorrhage I do not think that BP control or anticoagulation reversal would increase her chance of  survival.  Will discuss with neurology.  [JK]  2126 D/w Dr Cheral Marker. Agrees that the hemorrhage appears to be fateful.  Survivability is essentially zero.   Agrees with palliative care approach.  [JK]  2148 Updated daughter. We confirmed palliative care treatment.  Will continue to make sure she is comfortable.  [JK]    Clinical Course User Index [JK] Dorie Rank, MD    Patient presented to the emergency room with altered mental status and vomiting. Unfortunately she has a severe intracerebral hemorrhage. Her chance of survivability from this hemorrhage is extremely low.  I had a long discussion with the patient's daughter. Patient is DO NOT RESUSCITATE. We discussed the palliative treatment is the most reasonable plan at this time.  Plan is to admit for comfort care.  Final Clinical Impressions(s) / ED Diagnoses   Final diagnoses:  Right-sided nontraumatic intracerebral hemorrhage, unspecified cerebral location [facility])      Dorie Rank, MD 09/08/16 2150

## 2016-09-08 NOTE — ED Notes (Signed)
Bed: WA03 Expected date:  Expected time:  Means of arrival:  Comments: EMS vomiting  

## 2016-09-08 NOTE — ED Notes (Signed)
XR at bedside

## 2016-09-08 NOTE — ED Notes (Signed)
Hospitalist at bedside 

## 2016-09-08 NOTE — ED Notes (Signed)
ED Provider at bedside. 

## 2016-09-09 DIAGNOSIS — Z85828 Personal history of other malignant neoplasm of skin: Secondary | ICD-10-CM | POA: Diagnosis not present

## 2016-09-09 DIAGNOSIS — E875 Hyperkalemia: Secondary | ICD-10-CM | POA: Diagnosis not present

## 2016-09-09 DIAGNOSIS — I611 Nontraumatic intracerebral hemorrhage in hemisphere, cortical: Secondary | ICD-10-CM | POA: Diagnosis not present

## 2016-09-09 DIAGNOSIS — Z86711 Personal history of pulmonary embolism: Secondary | ICD-10-CM

## 2016-09-09 DIAGNOSIS — I252 Old myocardial infarction: Secondary | ICD-10-CM | POA: Diagnosis not present

## 2016-09-09 DIAGNOSIS — Z7189 Other specified counseling: Secondary | ICD-10-CM

## 2016-09-09 DIAGNOSIS — R1110 Vomiting, unspecified: Secondary | ICD-10-CM | POA: Diagnosis not present

## 2016-09-09 DIAGNOSIS — F0391 Unspecified dementia with behavioral disturbance: Secondary | ICD-10-CM | POA: Diagnosis not present

## 2016-09-09 DIAGNOSIS — R4182 Altered mental status, unspecified: Secondary | ICD-10-CM | POA: Diagnosis present

## 2016-09-09 DIAGNOSIS — Z87891 Personal history of nicotine dependence: Secondary | ICD-10-CM | POA: Diagnosis not present

## 2016-09-09 DIAGNOSIS — I6201 Nontraumatic acute subdural hemorrhage: Secondary | ICD-10-CM | POA: Diagnosis not present

## 2016-09-09 DIAGNOSIS — I10 Essential (primary) hypertension: Secondary | ICD-10-CM | POA: Diagnosis not present

## 2016-09-09 DIAGNOSIS — I610 Nontraumatic intracerebral hemorrhage in hemisphere, subcortical: Secondary | ICD-10-CM

## 2016-09-09 DIAGNOSIS — Z515 Encounter for palliative care: Secondary | ICD-10-CM

## 2016-09-09 DIAGNOSIS — F419 Anxiety disorder, unspecified: Secondary | ICD-10-CM | POA: Diagnosis not present

## 2016-09-09 DIAGNOSIS — Z7901 Long term (current) use of anticoagulants: Secondary | ICD-10-CM | POA: Diagnosis not present

## 2016-09-09 DIAGNOSIS — Z79899 Other long term (current) drug therapy: Secondary | ICD-10-CM | POA: Diagnosis not present

## 2016-09-09 DIAGNOSIS — I612 Nontraumatic intracerebral hemorrhage in hemisphere, unspecified: Secondary | ICD-10-CM | POA: Diagnosis not present

## 2016-09-09 DIAGNOSIS — Z66 Do not resuscitate: Secondary | ICD-10-CM | POA: Diagnosis not present

## 2016-09-09 MED ORDER — MORPHINE SULFATE (PF) 4 MG/ML IV SOLN
1.0000 mg | INTRAVENOUS | Status: DC | PRN
  Administered 2016-09-09: 1 mg via INTRAVENOUS
  Filled 2016-09-09: qty 1

## 2016-09-09 NOTE — Progress Notes (Signed)
Received request from [name], for family interest in [facility] with request for transfer today.  Chart reviewed.  Met with Charlett Nose and Opal Sidles, daughters, to confirm interest and explain services.  Family agreeable to transfer today.  Roselyn Reef, LCSW, aware.  Registration paperwork completed at 125pm.  Dr. Orpah Melter to assume care per family request.  Please fax discharge summary to [phone].  RN, please call report to [phone].  Please arrange transport for patient to arrive as soon as possible.  Thank you for the referral,  Edyth Gunnels, RN, BSN [facility] Liaison [phone]  All hospital liaisons are now on [place].

## 2016-09-09 NOTE — Consult Note (Signed)
Palliative Care Consult Note  HPI: Laurie Woods is a 83yo woman with a hx of dementia and DVT/Saddle PE on warfarin. She has had a progressive decline over the preceding few months in her cognitive status. She now presents from memory care unit with several day history of increased agitation, decreased appetite, then nausea/vomiting and left sided paralysis. In the ED CT head demonstrated a 5 x 9 x 8 cm intraparenchymal hemorrhage with a 96mm right SDH and 1.5cm midline shift. Neurology was spoken to and poor prognosis was indicated. Family elected to pursue comfort measures and she was admitted under observation for symptom management.   Physical Exam  Constitutional: She appears well-developed and well-nourished.  Obtunded  HENT:  Head: Normocephalic and atraumatic.  Cardiovascular: Normal rate and regular rhythm.   Pulmonary/Chest: Effort normal and breath sounds normal.  Abdominal: Soft.  Musculoskeletal:  Agitated by touch, withdraws from touch  Skin: Skin is warm and dry. There is pallor.  Flushed   Assessment: I arrived to the room shortly after [facility] arrived. Two daughters were present at the bedside, Opal Sidles and [name]. They related that they continue to want full comfort care, and have decided that transitioning to residential hospice is the best option. We discussed the transfer process, as well as the plan to provide a dose of morphine just prior to transfer. They had no other questions or concerns and understood the plan to transfer to [facility] today.  Plan: -Discharge to [facility] today; discussed with [facility] liaison; she will discuss morphine prior to transport with care nurse. She does appear stable for transport at this point.  Total time: 30 minutes  Charlynn Court [facility] Palliative Care  [phone]

## 2016-09-09 NOTE — Care Management Note (Signed)
Case Management Note  Patient Details  Name: Laurie Woods MRN: [id number] Date of Birth: 06/26/1919  Subjective/Objective: 83 y/o f admitted w/intracerebral hemorrhage. From facility.D/c residential hospice.                   Action/Plan:d/c residential hospice.   Expected Discharge Date:  09/09/16               Expected Discharge Plan:  [place]  In-House Referral:  Clinical Social Work  Discharge planning Services  CM Consult  Post Acute Care Choice:    Choice offered to:     DME Arranged:    DME Agency:     HH Arranged:    [place] Agency:     Status of Service:  Completed, signed off  If discussed at [employer], dates discussed:    Additional Comments:  Dessa Phi, RN 09/09/2016, 3:18 PM

## 2016-09-09 NOTE — Discharge Summary (Signed)
Discharge Summary  Laurie Woods OVF:[id number] DOB: June 11, 1919  PCP: Burnard Bunting, MD  Admit date: 09/08/2016 Discharge date: 09/09/2016  Time spent: >73mins, more than 50% time spent on coordination of care  Patient is discharged to residential hospice with full comfort measures.    Discharge Diagnoses:  Active Hospital Problems   Diagnosis Date Noted  . Intracerebral hemorrhage ([place]) 09/08/2016  . Goals of care, counseling/discussion   . Accelerated hypertension 09/08/2016  . Hyperkalemia 09/08/2016  . History of pulmonary embolus (PE) 09/08/2016  . Warfarin anticoagulation 09/08/2016  . Dementia with behavioral disturbance 04/01/2016    Resolved Hospital Problems   Diagnosis Date Noted Date Resolved  No resolved problems to display.    Discharge Condition: stable    Filed Weights   09/08/16 2350  Weight: 72.5 kg (159 lb 13.3 oz)    History of present illness:  PCP: Burnard Bunting, MD   Patient coming from: ALF via EMS  Chief Complaint: Altered mental status, nausea, vomiting  HPI: Laurie Woods is a 83 y.o. 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  History provided by the patient's daughter because the patient is obtunded.  She has remained in a memory care unit at [facility], but she has needed an increased level of care recently.  The patient has had increased agitation for several days, as well as decreased appetite.  She has not verbalized specific complaints of pain.  No known head trauma though she has had at least one fall since being on warfarin (she was not brought to medical attention because there was no apparent injury at that time).    Today, she developed nausea and vomiting and became acutely diaphoretic.  No documented fever.  Again, no specific complaints of pain.  It was  noted that she was not moving her left arm or leg.  911 was called and the patient was brought to the ED by EMS.  ED Course: She was found to have markedly elevated blood pressures (225/100 reported by EMS, 247/133 charted in the ED).  Unfortunately, head CT shows 5 x 9 x 8 cm intraparenchymal hemorrhage with a 45mm right SDH.  There is 1.5cm midline shift.  ED attending spoke to neurology regarding findings.  Poor prognosis.  Family understands and is agreeable to comfort measures only.  Hospitalist asked to admit.  Hospital Course:  Principal Problem:   Intracerebral hemorrhage (HCC) Active Problems:   Dementia with behavioral disturbance   Accelerated hypertension   Hyperkalemia   History of pulmonary embolus (PE)   Warfarin anticoagulation   Goals of care, counseling/discussion   Acute intracerebral bleeding with midline shift in a patient with a therapeutic INR.  Poor prognosis.  Patient's daughter has confirmed that goals of care are comfort only at this point. ---patient is unresponsive, NPO status --IV morphine as needed for pain or dyspnea --IV ativan as needed for agitation, restlessness, anxiety, distress -- palliative care consulted,  I have discussed with case with palliative care and social worker,  continue full comfort measures,  patient is to discharge to residential hospice.   Accelerated hypertension --Secondary to intracranial hemorrhage --We will not treat. -on full comfort measures    Code Status: DNR Family Communication: One daughter at bedside. Disposition Plan: Residential hospice  Consults called:  Neurology called in the ED but formal consult not performed.  palliative care consulted   Procedures:  none    Discharge Exam: BP (!) 146/55 (BP Location: Right Arm)   Pulse 82   Temp 99.8 F (37.7 C) (Oral)   Resp 18   Wt 72.5 kg (159 lb 13.3 oz)   SpO2 93%   BMI 26.60 kg/m   General: unresponsive, seems comfortable Cardiovascular:  RRR Respiratory:Normal respiratory effort. No accessory muscle use.    Allergies as of 09/09/2016   No Known Allergies     Medication List    STOP taking these medications   busPIRone 7.5 MG tablet Commonly known as:  BUSPAR   LORazepam 0.5 MG tablet Commonly known as:  ATIVAN   MINERIN Crea   sertraline 50 MG tablet Commonly known as:  ZOLOFT   warfarin 1 MG tablet Commonly known as:  COUMADIN   warfarin 4 MG tablet Commonly known as:  COUMADIN      No Known Allergies    The results of significant diagnostics from this hospitalization (including imaging, microbiology, ancillary and laboratory) are listed below for reference.    Significant Diagnostic Studies: Dg Abdomen 1 View  Result Date: 09/08/2016 CLINICAL DATA:  Vomiting and abdominal pain. EXAM: ABDOMEN - 1 VIEW COMPARISON:  None. FINDINGS: The bowel gas pattern is normal. No radio-opaque calculi or other significant radiographic abnormality are seen. IMPRESSION: Negative. Electronically Signed   By: Margarette Canada M.D.   On: 09/08/2016 21:02   Ct Head Wo Contrast  Result Date: 09/08/2016 CLINICAL DATA:  83 year old female with altered mental status and confusion. EXAM: CT HEAD WITHOUT CONTRAST TECHNIQUE: Contiguous axial images were obtained from the base of the skull through the vertex without intravenous contrast. COMPARISON:  None. FINDINGS: Brain: A 5 x 9 x 8 cm intraparenchymal hematoma within the right frontal/temporal/parietal region is noted with moderate amount of adjacent edema and 1.5 cm right to left midline shift. A 3 mm right frontoparietal subdural hematoma is also noted. Left lateral ventricular enlargement and effacement of the right basilar cisterns noted. Probable chronic small-vessel Woods matter ischemic changes are present. Vascular: Intracranial atherosclerotic calcifications identified. Skull: Normal. Negative for fracture or focal lesion. Sinuses/Orbits: No acute finding. Other: None.  IMPRESSION: 5 x 9 x 8 cm right intraparenchymal hematoma and 3 mm right subdural hematoma causing 1.5 cm right to left midline shift, left lateral ventricle enlargement and effacement of the right basilar cisterns. Critical Value/emergent results were called by telephone at the time of interpretation on 09/08/2016 at 9:33 pm to Dr. Dorie Rank , who verbally acknowledged these results. Electronically Signed   By: Margarette Canada M.D.   On: 09/08/2016 21:34   Dg Chest Portable 1 View  Result Date: 09/08/2016 CLINICAL DATA:  Altered mental status with vomiting and fever. EXAM: PORTABLE CHEST 1 VIEW COMPARISON:  07/06/2011 FINDINGS: Cardiomegaly noted. There is no evidence of focal airspace disease, pulmonary edema, suspicious pulmonary nodule/mass, pleural effusion, or pneumothorax. No acute bony abnormalities are identified. IMPRESSION: Cardiomegaly without evidence of acute cardiopulmonary disease. Electronically Signed   By: Margarette Canada M.D.   On: 09/08/2016 21:02    Microbiology: No results found for this or any previous visit (from the past 240 hour(s)).   Labs: Basic Metabolic Panel:  Recent Labs Lab 09/08/16 1954  NA 135  K 5.9*  CL 102  CO2 21*  GLUCOSE 199*  BUN 10  CREATININE 1.09*  CALCIUM 8.8*   Liver Function Tests:  Recent Labs Lab 09/08/16 1954  AST 47*  ALT <5*  ALKPHOS 116  BILITOT 1.9*  PROT 6.9  ALBUMIN 4.1    Recent Labs Lab 09/08/16 1954  LIPASE 18   No results for input(s): AMMONIA in the last 168 hours. CBC:  Recent Labs Lab 09/08/16 1954  WBC 15.4*  NEUTROABS 13.2*  HGB 13.9  HCT 42.9  MCV 89.9  PLT 358   Cardiac Enzymes: No results for input(s): CKTOTAL, CKMB, CKMBINDEX, TROPONINI in the last 168 hours. BNP: BNP (last 3 results) No results for input(s): BNP in the last 8760 hours.  ProBNP (last 3 results) No results for input(s): PROBNP in the last 8760 hours.  CBG: No results for input(s): GLUCAP in the last 168  hours.     SignedFlorencia Reasons MD, PhD  [facility] 09/09/2016, 2:29 PM

## 2016-09-09 NOTE — Clinical Social Work Note (Signed)
Clinical Social Work Assessment  Patient Details  Name: Laurie Woods MRN: [id number] Date of Birth: [birth year]  Date of referral:  09/09/16               Reason for consult:  End of Life/Hospice                Permission sought to share information with:  Facility Art therapist granted to share information::  Yes, Verbal Permission Granted  Name::        Agency::     Relationship::     Contact Information:     Housing/Transportation Living arrangements for the past 2 months:  [place] of Information:  Adult Children Patient Interpreter Needed:  None Criminal Activity/Legal Involvement Pertinent to Current Situation/Hospitalization:  No - Comment as needed Significant Relationships:  Adult Children Lives with:  Facility Resident Do you feel safe going back to the place where you live?   (Residential Hospice Home recommended.) Need for family participation in patient care:     Care giving concerns: No concerns reported by daughter at this time.   Social Worker assessment / plan:  Pt hospitalized from [facility] memory care unit on 09/08/16 under Observation Status. CSW consulted for Residential Hospice Home placement. CSW met with pt's daughter, Laurie Woods , at bedside. Pt is disoriented x 4 and unable to participate in dc planning. CSW provided daughter with hospice home choice. Daughter has chosen [facility]. CSW has contacted Erling Conte, liaison, from [facility] and request for placement has been approved. CSW will assist with d/c planning to [facility] today.  Employment status:  Retired Forensic scientist:  Medicare PT Recommendations:  Not assessed at this time [place] / Referral to community resources:  Other (Comment Required) (Residential Hospice Home info )  Patient/Family's Response to care:  Daughter is requesting Residential Hospice Home placement.   Patient/Family's Understanding of and Emotional Response to  Diagnosis, Current Treatment, and Prognosis:  Daughter is aware of pt's medical status and poor prognosis. " We have been expecting this decline. " Support provided.  Emotional Assessment Appearance:  Appears stated age Attitude/Demeanor/Rapport:  Unable to Assess Affect (typically observed):  Unable to Assess Orientation:   (disoriented x 4) Alcohol / Substance use:  Not Applicable Psych involvement (Current and /or in the community):  No (Comment)  Discharge Needs  Concerns to be addressed:  Discharge Planning Concerns Readmission within the last 30 days:  No Current discharge risk:  None Barriers to Discharge:  No Barriers Identified   [name], [place] 09/09/2016, 1:27 PM

## 2016-09-09 NOTE — Progress Notes (Signed)
CSW called [facility] for transport. ETA: 1 Hour, nurse notified family.  D/C summary faxed to [place], Latanya Presser, MSW Clinical Social Worker 5E and Psychiatric Service Line [phone] 09/09/2016  2:46 PM

## 2016-09-13 DEATH — deceased

## 2018-08-25 IMAGING — DX DG ABDOMEN 1V
1 series · 1 of 1 positions shown · non-contrast
Comparison: None.

CLINICAL DATA: Vomiting and abdominal pain.

EXAM:
ABDOMEN - 1 VIEW

[abdomen kub]
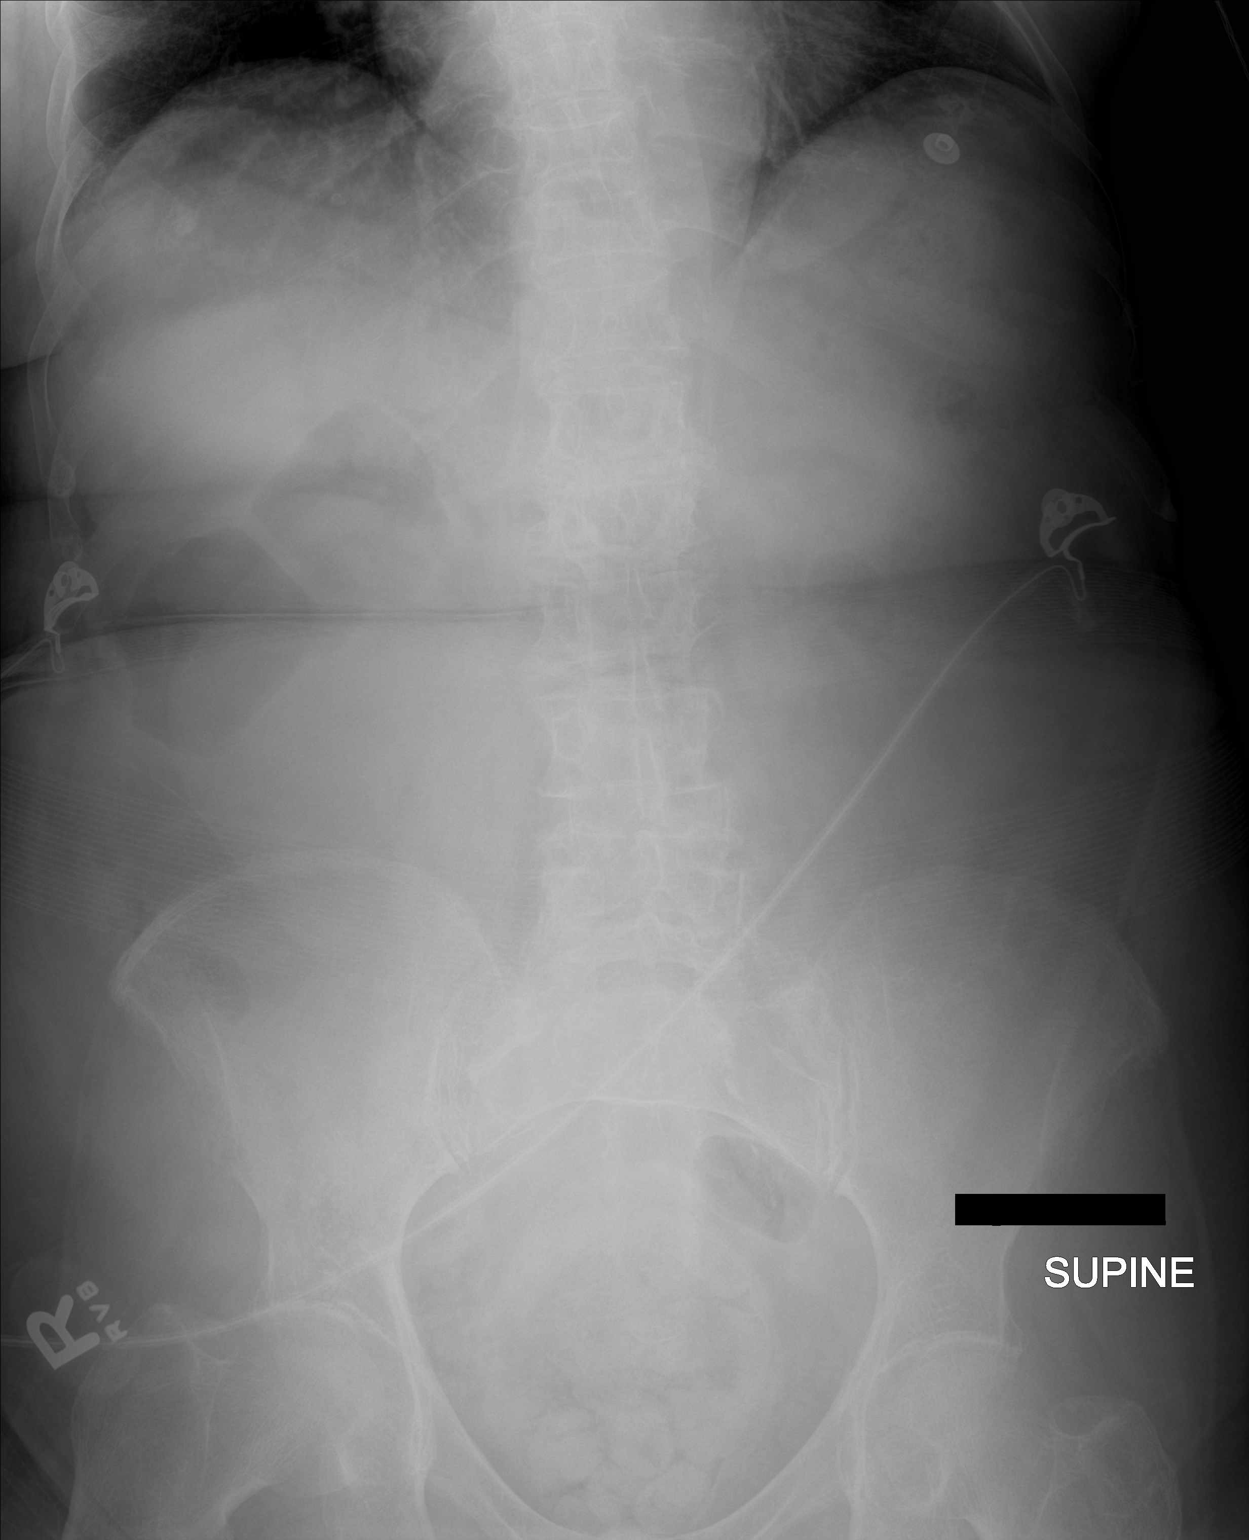

[1 of 1 positions shown; findings below may reference images not displayed]

FINDINGS: The bowel gas pattern is normal. No radio-opaque calculi or other
significant radiographic abnormality are seen.
IMPRESSION: Negative.

## 2018-08-25 IMAGING — CT CT HEAD W/O CM
3 of 4 series · 15 of 47 positions shown, 18 images · non-contrast
Comparison: None.

CLINICAL DATA: [AGE] female with altered mental status and
confusion.

EXAM:
CT HEAD WITHOUT CONTRAST
TECHNIQUE: Contiguous axial images were obtained from the base of the skull
through the vertex without intravenous contrast.

[Series 2: head w/o · axial · non-contrast · 0.38mm/px · z∈[-281,-166]mm · 9 of 27 slices shown, 12 images]
[im 2/27  brain]
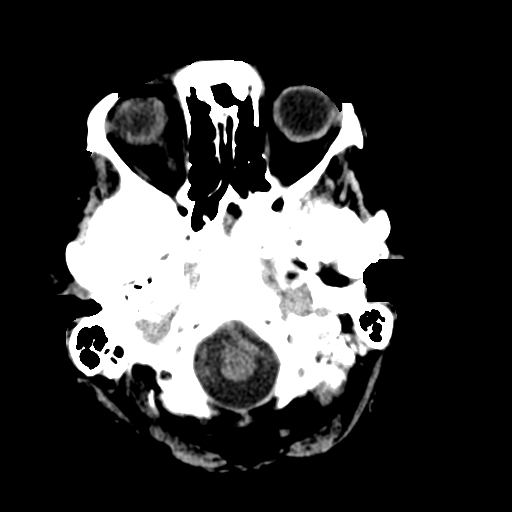
[im 2/27  bone]
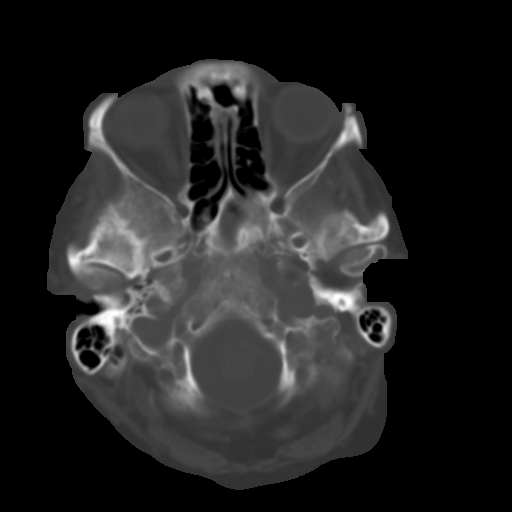
[im 6/27  brain]
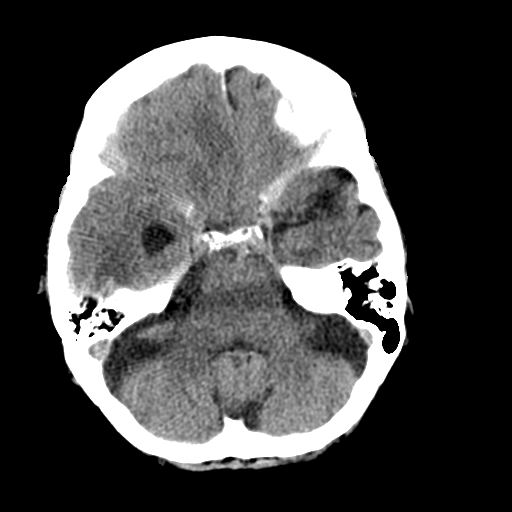
[im 8/27  brain]
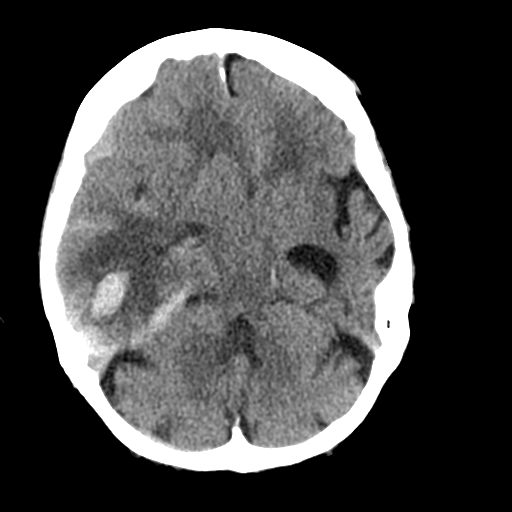
[im 12/27  brain]
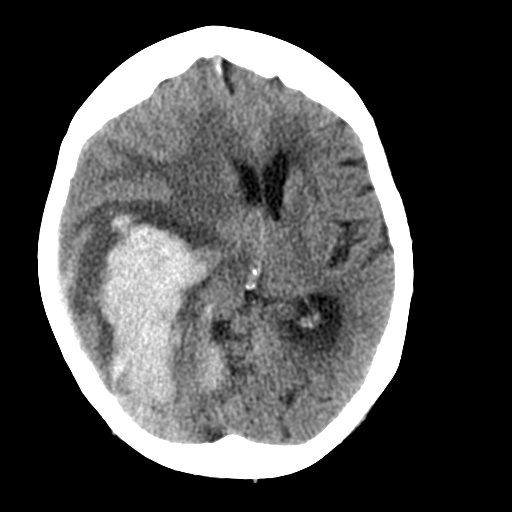
[im 14/27  brain]
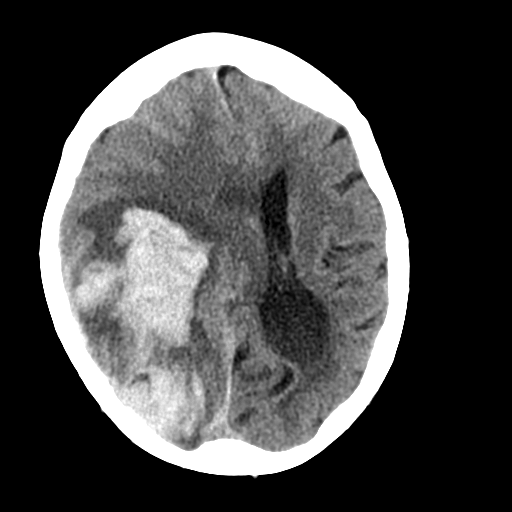
[im 14/27  bone]
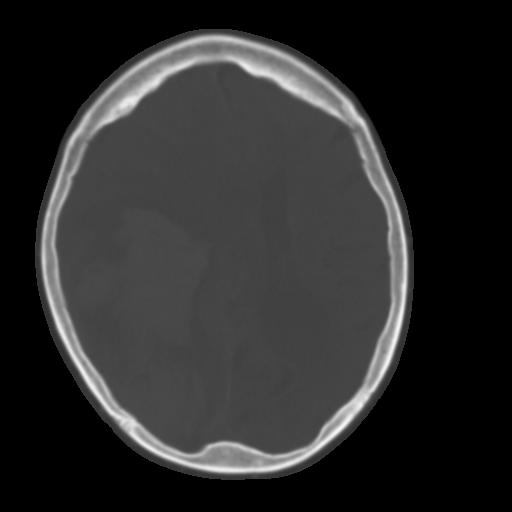
[im 15/27  brain]
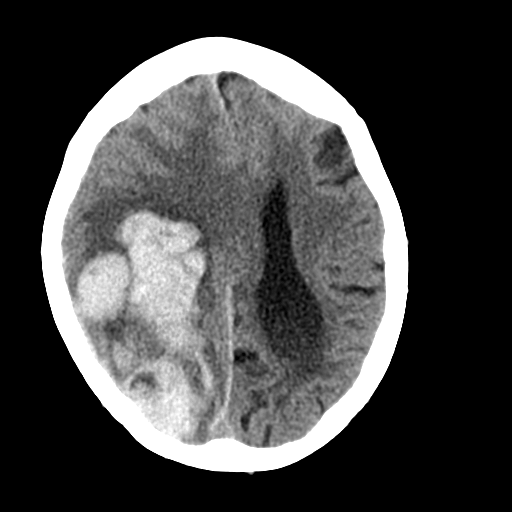
[im 19/27  brain]
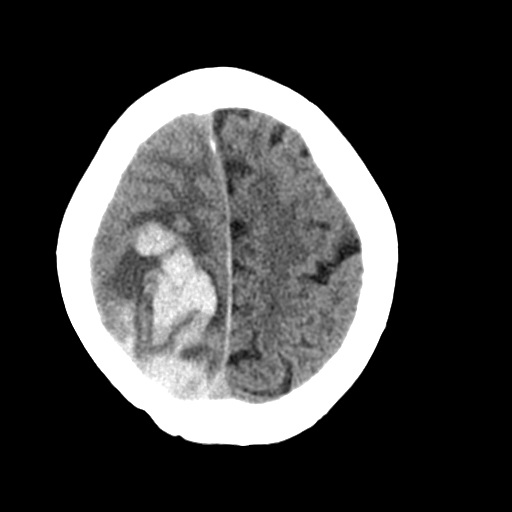
[im 21/27  brain]
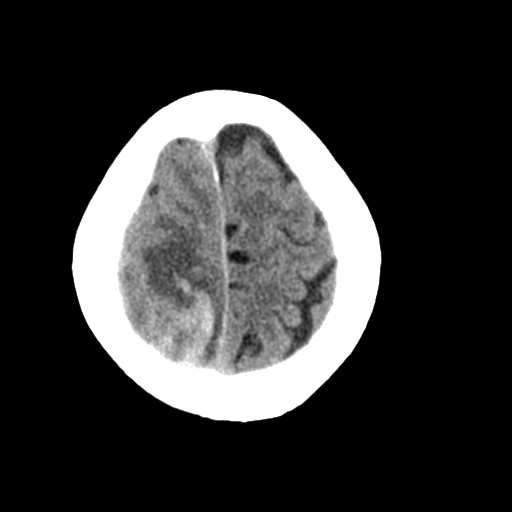
[im 25/27  brain]
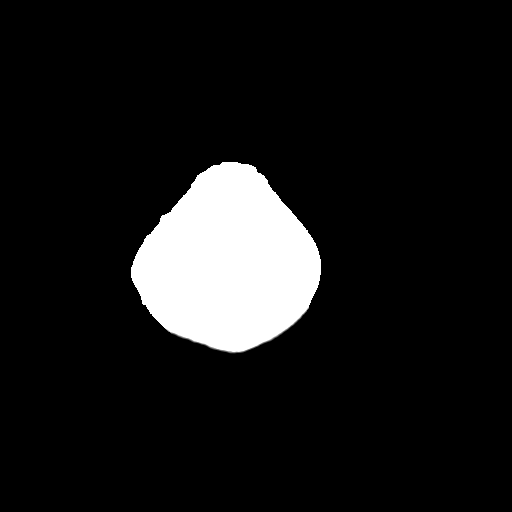
[im 25/27  bone]
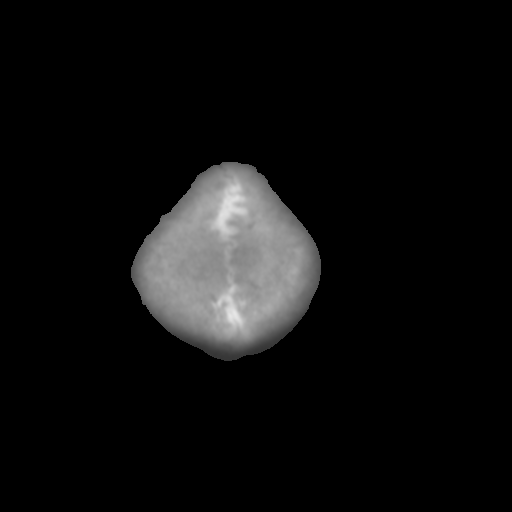

[Series 5: sagittal · sagittal · 0.25mm/px · 3 of 52 slices shown]
[im 18/52  brain]
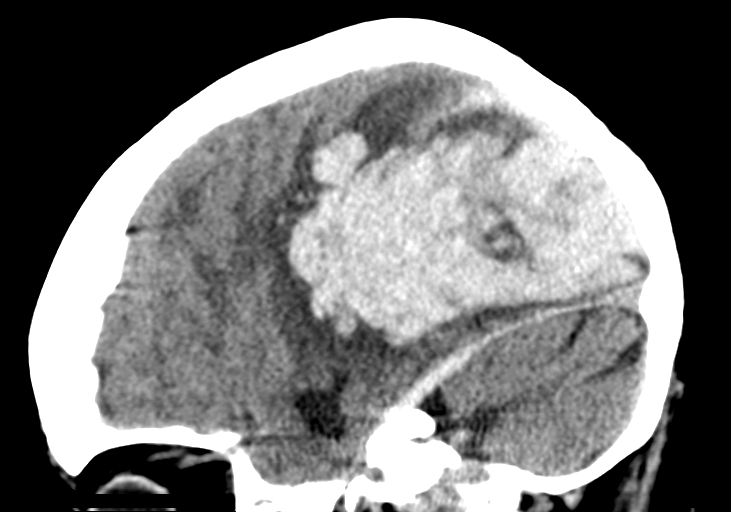
[im 26/52  brain]
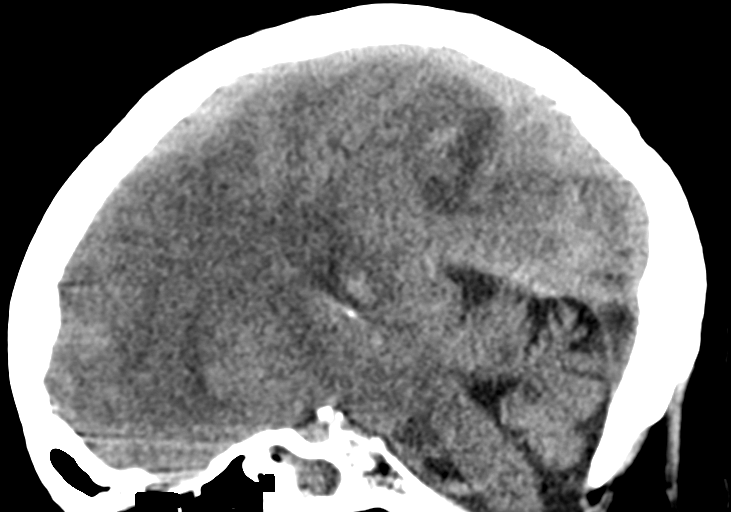
[im 35/52  brain]
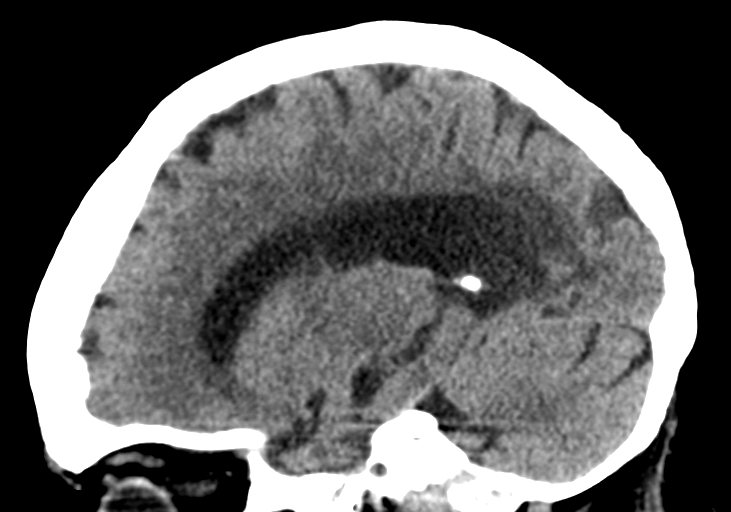

[Series 6: coronal · coronal · 0.26mm/px · 3 of 62 slices shown]
[im 21/62  brain]
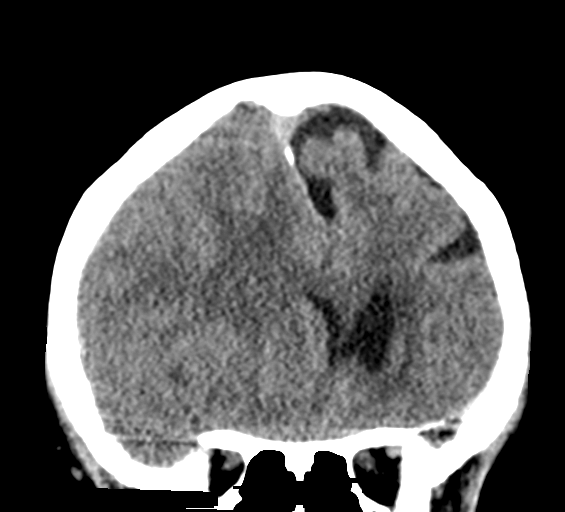
[im 28/62  brain]
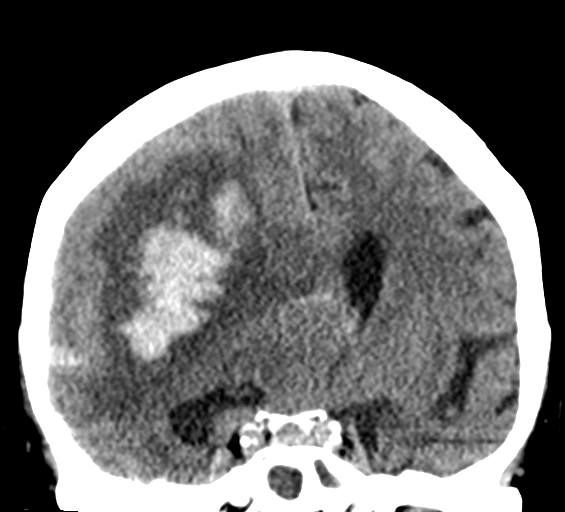
[im 34/62  brain]
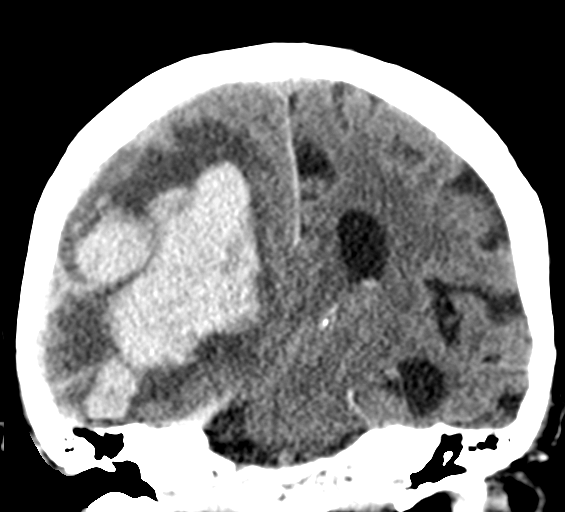

[15 of 47 positions shown; findings below may reference images not displayed]

FINDINGS: Brain: A 5 x 9 x 8 cm intraparenchymal hematoma within the right
frontal/temporal/parietal region is noted with moderate amount of
adjacent edema and 1.5 cm right to left midline shift. A 3 mm right
frontoparietal subdural hematoma is also noted. Left lateral
ventricular enlargement and effacement of the right basilar cisterns
noted.

Probable chronic small-vessel white matter ischemic changes are
present.

Vascular: Intracranial atherosclerotic calcifications identified.

Skull: Normal. Negative for fracture or focal lesion.

Sinuses/Orbits: No acute finding.

Other: None.
IMPRESSION: 5 x 9 x 8 cm right intraparenchymal hematoma and 3 mm right subdural
hematoma causing 1.5 cm right to left midline shift, left lateral
ventricle enlargement and effacement of the right basilar cisterns.

Critical Value/emergent results were called by telephone at the time
of interpretation on 09/08/2016 at [DATE] to Dr. LAGNIDILA PERATH , who
verbally acknowledged these results.
